# Patient Record
Sex: Female | Born: 1935 | Race: White | Hispanic: No | Marital: Single | Smoking: Former smoker
Health system: Southern US, Community
[De-identification: ages and names within clinical notes are randomized; demographics above are authoritative.]

## PROBLEM LIST (undated history)

## (undated) DIAGNOSIS — E119 Type 2 diabetes mellitus without complications: Secondary | ICD-10-CM

## (undated) DIAGNOSIS — K219 Gastro-esophageal reflux disease without esophagitis: Secondary | ICD-10-CM

## (undated) DIAGNOSIS — J45909 Unspecified asthma, uncomplicated: Secondary | ICD-10-CM

## (undated) DIAGNOSIS — M1990 Unspecified osteoarthritis, unspecified site: Secondary | ICD-10-CM

## (undated) DIAGNOSIS — I499 Cardiac arrhythmia, unspecified: Secondary | ICD-10-CM

## (undated) DIAGNOSIS — E079 Disorder of thyroid, unspecified: Secondary | ICD-10-CM

## (undated) DIAGNOSIS — I10 Essential (primary) hypertension: Secondary | ICD-10-CM

## (undated) DIAGNOSIS — C50919 Malignant neoplasm of unspecified site of unspecified female breast: Secondary | ICD-10-CM

## (undated) DIAGNOSIS — F0390 Unspecified dementia without behavioral disturbance: Secondary | ICD-10-CM

## (undated) HISTORY — PX: MASTECTOMY: SHX3

## (undated) HISTORY — PX: CHOLECYSTECTOMY: SHX55

---

## 2017-09-29 ENCOUNTER — Other Ambulatory Visit: Payer: Self-pay

## 2017-09-29 ENCOUNTER — Encounter (HOSPITAL_BASED_OUTPATIENT_CLINIC_OR_DEPARTMENT_OTHER): Payer: Self-pay

## 2017-09-29 ENCOUNTER — Observation Stay (HOSPITAL_BASED_OUTPATIENT_CLINIC_OR_DEPARTMENT_OTHER)
Admission: EM | Admit: 2017-09-29 | Discharge: 2017-09-30 | Disposition: A | Discharge status: Home Health | Payer: Medicare Other | Attending: Internal Medicine | Admitting: Internal Medicine

## 2017-09-29 DIAGNOSIS — J45909 Unspecified asthma, uncomplicated: Secondary | ICD-10-CM | POA: Diagnosis not present

## 2017-09-29 DIAGNOSIS — Z7984 Long term (current) use of oral hypoglycemic drugs: Secondary | ICD-10-CM | POA: Insufficient documentation

## 2017-09-29 DIAGNOSIS — Z888 Allergy status to other drugs, medicaments and biological substances status: Secondary | ICD-10-CM | POA: Insufficient documentation

## 2017-09-29 DIAGNOSIS — E785 Hyperlipidemia, unspecified: Secondary | ICD-10-CM | POA: Diagnosis not present

## 2017-09-29 DIAGNOSIS — F329 Major depressive disorder, single episode, unspecified: Secondary | ICD-10-CM | POA: Diagnosis not present

## 2017-09-29 DIAGNOSIS — F0390 Unspecified dementia without behavioral disturbance: Secondary | ICD-10-CM | POA: Insufficient documentation

## 2017-09-29 DIAGNOSIS — Z886 Allergy status to analgesic agent status: Secondary | ICD-10-CM | POA: Insufficient documentation

## 2017-09-29 DIAGNOSIS — I10 Essential (primary) hypertension: Secondary | ICD-10-CM | POA: Diagnosis not present

## 2017-09-29 DIAGNOSIS — M1990 Unspecified osteoarthritis, unspecified site: Secondary | ICD-10-CM | POA: Insufficient documentation

## 2017-09-29 DIAGNOSIS — E119 Type 2 diabetes mellitus without complications: Secondary | ICD-10-CM | POA: Insufficient documentation

## 2017-09-29 DIAGNOSIS — Z882 Allergy status to sulfonamides status: Secondary | ICD-10-CM | POA: Insufficient documentation

## 2017-09-29 DIAGNOSIS — K219 Gastro-esophageal reflux disease without esophagitis: Secondary | ICD-10-CM | POA: Insufficient documentation

## 2017-09-29 DIAGNOSIS — Z7951 Long term (current) use of inhaled steroids: Secondary | ICD-10-CM | POA: Diagnosis not present

## 2017-09-29 DIAGNOSIS — G2581 Restless legs syndrome: Secondary | ICD-10-CM | POA: Diagnosis not present

## 2017-09-29 DIAGNOSIS — Z79891 Long term (current) use of opiate analgesic: Secondary | ICD-10-CM | POA: Diagnosis not present

## 2017-09-29 DIAGNOSIS — Z87891 Personal history of nicotine dependence: Secondary | ICD-10-CM | POA: Diagnosis not present

## 2017-09-29 DIAGNOSIS — Z79899 Other long term (current) drug therapy: Secondary | ICD-10-CM | POA: Insufficient documentation

## 2017-09-29 DIAGNOSIS — E039 Hypothyroidism, unspecified: Secondary | ICD-10-CM | POA: Insufficient documentation

## 2017-09-29 DIAGNOSIS — E876 Hypokalemia: Secondary | ICD-10-CM | POA: Diagnosis present

## 2017-09-29 DIAGNOSIS — Z853 Personal history of malignant neoplasm of breast: Secondary | ICD-10-CM | POA: Diagnosis not present

## 2017-09-29 DIAGNOSIS — E860 Dehydration: Principal | ICD-10-CM | POA: Insufficient documentation

## 2017-09-29 HISTORY — DX: Cardiac arrhythmia, unspecified: I49.9

## 2017-09-29 HISTORY — DX: Malignant neoplasm of unspecified site of unspecified female breast: C50.919

## 2017-09-29 HISTORY — DX: Essential (primary) hypertension: I10

## 2017-09-29 HISTORY — DX: Gastro-esophageal reflux disease without esophagitis: K21.9

## 2017-09-29 HISTORY — DX: Unspecified dementia, unspecified severity, without behavioral disturbance, psychotic disturbance, mood disturbance, and anxiety: F03.90

## 2017-09-29 HISTORY — DX: Type 2 diabetes mellitus without complications: E11.9

## 2017-09-29 HISTORY — DX: Unspecified asthma, uncomplicated: J45.909

## 2017-09-29 HISTORY — DX: Unspecified osteoarthritis, unspecified site: M19.90

## 2017-09-29 HISTORY — DX: Disorder of thyroid, unspecified: E07.9

## 2017-09-29 LAB — CBC WITH DIFFERENTIAL/PLATELET
Basophils Absolute: 0 10*3/uL (ref 0.0–0.1)
Basophils Relative: 0 %
EOS ABS: 0.1 10*3/uL (ref 0.0–0.7)
EOS PCT: 0 %
HCT: 31.8 % — ABNORMAL LOW (ref 36.0–46.0)
Hemoglobin: 11.1 g/dL — ABNORMAL LOW (ref 12.0–15.0)
LYMPHS ABS: 1.1 10*3/uL (ref 0.7–4.0)
LYMPHS PCT: 7 %
MCH: 31.9 pg (ref 26.0–34.0)
MCHC: 34.9 g/dL (ref 30.0–36.0)
MCV: 91.4 fL (ref 78.0–100.0)
MONO ABS: 0.9 10*3/uL (ref 0.1–1.0)
MONOS PCT: 5 %
Neutro Abs: 14 10*3/uL — ABNORMAL HIGH (ref 1.7–7.7)
Neutrophils Relative %: 88 %
PLATELETS: 292 10*3/uL (ref 150–400)
RBC: 3.48 MIL/uL — ABNORMAL LOW (ref 3.87–5.11)
RDW: 13.1 % (ref 11.5–15.5)
WBC: 16 10*3/uL — ABNORMAL HIGH (ref 4.0–10.5)

## 2017-09-29 LAB — COMPREHENSIVE METABOLIC PANEL
ALT: 37 U/L (ref 14–54)
ANION GAP: 16 — AB (ref 5–15)
AST: 61 U/L — ABNORMAL HIGH (ref 15–41)
Albumin: 3.1 g/dL — ABNORMAL LOW (ref 3.5–5.0)
Alkaline Phosphatase: 57 U/L (ref 38–126)
BUN: 22 mg/dL — ABNORMAL HIGH (ref 6–20)
CALCIUM: 7.8 mg/dL — AB (ref 8.9–10.3)
CHLORIDE: 93 mmol/L — AB (ref 101–111)
CO2: 22 mmol/L (ref 22–32)
Creatinine, Ser: 0.95 mg/dL (ref 0.44–1.00)
GFR, EST NON AFRICAN AMERICAN: 55 mL/min — AB (ref >60)
Glucose, Bld: 255 mg/dL — ABNORMAL HIGH (ref 65–99)
Potassium: 2.6 mmol/L — CL (ref 3.5–5.1)
SODIUM: 131 mmol/L — AB (ref 135–145)
Total Bilirubin: 0.6 mg/dL (ref 0.3–1.2)
Total Protein: 7.2 g/dL (ref 6.5–8.1)

## 2017-09-29 LAB — URINALYSIS, ROUTINE W REFLEX MICROSCOPIC
BILIRUBIN URINE: NEGATIVE
Glucose, UA: NEGATIVE mg/dL
Hgb urine dipstick: NEGATIVE
KETONES UR: NEGATIVE mg/dL
NITRITE: NEGATIVE
Protein, ur: NEGATIVE mg/dL
SPECIFIC GRAVITY, URINE: 1.01 (ref 1.005–1.030)
pH: 6 (ref 5.0–8.0)

## 2017-09-29 LAB — MAGNESIUM: Magnesium: 1 mg/dL — ABNORMAL LOW (ref 1.7–2.4)

## 2017-09-29 LAB — URINALYSIS, MICROSCOPIC (REFLEX): RBC / HPF: NONE SEEN RBC/hpf (ref 0–5)

## 2017-09-29 LAB — PHOSPHORUS: PHOSPHORUS: 3.8 mg/dL (ref 2.5–4.6)

## 2017-09-29 MED ORDER — POTASSIUM CHLORIDE 10 MEQ/100ML IV SOLN
10.0000 meq | Freq: Once | INTRAVENOUS | Status: AC
  Administered 2017-09-29: 10 meq via INTRAVENOUS
  Filled 2017-09-29: qty 100

## 2017-09-29 MED ORDER — POTASSIUM CHLORIDE CRYS ER 20 MEQ PO TBCR
40.0000 meq | EXTENDED_RELEASE_TABLET | Freq: Once | ORAL | Status: AC
  Administered 2017-09-29: 40 meq via ORAL
  Filled 2017-09-29: qty 2

## 2017-09-29 MED ORDER — SODIUM CHLORIDE 0.9 % IV SOLN
1.0000 g | Freq: Once | INTRAVENOUS | Status: AC
  Administered 2017-09-29: 1 g via INTRAVENOUS
  Filled 2017-09-29: qty 10

## 2017-09-29 MED ORDER — SODIUM CHLORIDE 0.9 % IV SOLN: INTRAVENOUS | Status: DC

## 2017-09-29 MED ORDER — SODIUM CHLORIDE 0.9 % IV SOLN
INTRAVENOUS | Status: DC
  Administered 2017-09-29 – 2017-09-30 (×3): via INTRAVENOUS

## 2017-09-29 MED ORDER — SODIUM CHLORIDE 0.9 % IV BOLUS (SEPSIS)
500.0000 mL | Freq: Once | INTRAVENOUS | Status: AC
  Administered 2017-09-29: 500 mL via INTRAVENOUS

## 2017-09-29 NOTE — ED Notes (Signed)
ED Provider at bedside. 

## 2017-09-29 NOTE — ED Notes (Signed)
Daughter at bedside during this transfer.  Patient was able to eat dinner prior to transfer.  Transported via [facility] in stable condition.

## 2017-09-29 NOTE — ED Triage Notes (Signed)
Per pt's daughter pt with increase in weakness-recently treated for UTI and diarrhea-pt NAD-presents to triage in w/c

## 2017-09-29 NOTE — ED Notes (Signed)
Date and time results received: 09/29/17 2011  Test: potassium Critical Value: 2.6  Name of Provider Notified: Dr. Rogene Houston  Orders Received? Or Actions Taken?: awaiting further orders

## 2017-09-29 NOTE — ED Provider Notes (Signed)
[facility] EMERGENCY DEPARTMENT Provider Note   CSN: [id number] Arrival date & time: 09/29/17  1857     History   Chief Complaint Chief Complaint  Patient presents with  . Weakness    HPI Adriana Sanchez is a 83 y.o. female.  Patient brought in by her daughter.  Patient has been experiencing weakness got significantly weaker today.  About a week ago was having a diarrheal illness.  And was treated for UTI.  Patient's last diarrhea bowel movement was on Sunday.  No complaint of pain no abdominal pain no nausea no vomiting.  Just global weakness.  No fevers.  No upper respiratory or flulike symptoms.  Has had confirmation that the urinary tract infection had resolved.      Past Medical History:  Diagnosis Date  . Arthritis   . Asthma   . Breast cancer ([place])   . Dementia   . Diabetes mellitus without complication ([place])   . GERD (gastroesophageal reflux disease)   . Hypertension   . Irregular heartbeat   . Thyroid disease     There are no active problems to display for this patient.   Past Surgical History:  Procedure Laterality Date  . CHOLECYSTECTOMY    . MASTECTOMY      OB History    No data available       Home Medications    Prior to Admission medications   Medication Sig Start Date End Date Taking? Authorizing Provider  albuterol (ACCUNEB) 1.25 MG/3ML nebulizer solution Take 1 ampule by nebulization every 6 (six) hours as needed for wheezing.   Yes [provider]  atorvastatin (LIPITOR) 10 MG tablet Take 10 mg by mouth daily.   Yes [provider]  calcium-vitamin D (OSCAL WITH D) 250-125 MG-UNIT tablet Take 1 tablet by mouth daily.   Yes [provider]  donepezil (ARICEPT) 10 MG tablet Take 10 mg by mouth at bedtime.   Yes [provider]  DULoxetine (CYMBALTA) 30 MG capsule Take 30 mg by mouth daily.   Yes [provider]  fluticasone furoate-vilanterol (BREO ELLIPTA) 100-25 MCG/INH AEPB Inhale 1  puff into the lungs daily.   Yes [provider]  furosemide (LASIX) 20 MG tablet Take 20 mg by mouth.   Yes [provider]  ketoconazole (NIZORAL) 2 % cream Apply 1 application topically daily.   Yes [provider]  Lactobacillus Acidophilus POWD by Does not apply route.   Yes [provider]  lansoprazole (PREVACID) 30 MG capsule Take 30 mg by mouth daily at 12 noon.   Yes [provider]  levothyroxine (SYNTHROID, LEVOTHROID) 50 MCG tablet Take 50 mcg by mouth daily before breakfast.   Yes [provider]  loratadine (CLARITIN) 10 MG tablet Take 10 mg by mouth daily.   Yes [provider]  magnesium 30 MG tablet Take 30 mg by mouth once.   Yes [provider]  memantine (NAMENDA) 10 MG tablet Take 10 mg by mouth 2 (two) times daily.   Yes [provider]  metFORMIN (GLUCOPHAGE) 500 MG tablet Take 1,000 mg by mouth daily with breakfast.   Yes [provider]  metoprolol tartrate (LOPRESSOR) 50 MG tablet Take 50 mg by mouth 2 (two) times daily.   Yes [provider]  metroNIDAZOLE (FLAGYL) 500 MG tablet Take 500 mg by mouth 3 (three) times daily.   Yes [provider]  montelukast (SINGULAIR) 10 MG tablet Take 10 mg by mouth at bedtime.  Yes [provider]  nystatin (MYCOSTATIN) 100000 UNIT/ML suspension Take 5 mLs by mouth 4 (four) times daily.   Yes [provider]  pantoprazole (PROTONIX) 40 MG tablet Take 40 mg by mouth daily.   Yes [provider]  potassium gluconate 595 (99 K) MG TABS tablet Take 595 mg by mouth daily.   Yes [provider]  rOPINIRole (REQUIP) 1 MG tablet Take 1 mg by mouth at bedtime.   Yes [provider]  telmisartan (MICARDIS) 40 MG tablet Take 40 mg by mouth daily.   Yes [provider]  tolterodine (DETROL LA) 4 MG 24 hr capsule Take 4 mg by mouth daily.   Yes [provider]  traMADol (ULTRAM)  50 MG tablet Take by mouth daily.   Yes [provider]  vitamin C (ASCORBIC ACID) 500 MG tablet Take 500 mg by mouth daily.   Yes [provider]  Zinc Sulfate (ZINC 15 PO) Take by mouth 2 (two) times daily.   Yes [provider]    Family History No family history on file.  Social History Social History   Tobacco Use  . Smoking status: Former Research scientist (life sciences)  . Smokeless tobacco: Never Used  Substance Use Topics  . Alcohol use: No    Frequency: Never  . Drug use: No     Allergies   Aspirin; Simvastatin; Sulfa antibiotics; and Univasc [moexipril]   Review of Systems Review of Systems  Constitutional: Positive for fatigue. Negative for fever.  HENT: Negative for congestion.   Eyes: Negative for redness.  Respiratory: Negative for cough and shortness of breath.   Cardiovascular: Negative for chest pain.  Gastrointestinal: Positive for diarrhea. Negative for abdominal pain, nausea and vomiting.  Genitourinary: Negative for dysuria.  Musculoskeletal: Negative for myalgias.  Skin: Negative for rash.  Neurological: Positive for weakness. Negative for dizziness, syncope, speech difficulty and headaches.  Hematological: Does not bruise/bleed easily.  Psychiatric/Behavioral: Negative for confusion.     Physical Exam Updated Vital Signs BP 137/72 (BP Location: Left Arm)   Pulse 79   Temp 98.3 F (36.8 C) (Oral)   Resp (!) 22   Ht 1.499 m (4\' 11" )   Wt 62.6 kg (138 lb)   SpO2 98%   BMI 27.87 kg/m   Physical Exam  Constitutional: She is oriented to person, place, and time. She appears well-developed and well-nourished.  HENT:  Head: Normocephalic and atraumatic.  Mucous membranes slightly dry.  Eyes: Conjunctivae and EOM are normal. Pupils are equal, round, and reactive to light.  Neck: Normal range of motion. Neck supple.  Cardiovascular: Normal rate, regular rhythm and normal heart sounds.  Pulmonary/Chest: Effort normal and breath sounds  normal. No respiratory distress.  Abdominal: Soft. Bowel sounds are normal. She exhibits distension. There is no tenderness.  Abdomen nontender.  Musculoskeletal: Normal range of motion. She exhibits no edema.  Neurological: She is alert and oriented to person, place, and time. No cranial nerve deficit or sensory deficit. She exhibits normal muscle tone. Coordination normal.  Skin: Skin is warm. Capillary refill takes less than 2 seconds. No rash noted.  Nursing note and vitals reviewed.    ED Treatments / Results  Labs (all labs ordered are listed, but only abnormal results are displayed) Labs Reviewed  CBC WITH DIFFERENTIAL/PLATELET - Abnormal; Notable for the following components:      Result Value   WBC 16.0 (*)    RBC 3.48 (*)    Hemoglobin 11.1 (*)  HCT 31.8 (*)    Neutro Abs 14.0 (*)    All other components within normal limits  COMPREHENSIVE METABOLIC PANEL - Abnormal; Notable for the following components:   Sodium 131 (*)    Potassium 2.6 (*)    Chloride 93 (*)    Glucose, Bld 255 (*)    BUN 22 (*)    Calcium 7.8 (*)    Albumin 3.1 (*)    AST 61 (*)    GFR calc non Af Amer 55 (*)    Anion gap 16 (*)    All other components within normal limits  URINE CULTURE  URINALYSIS, ROUTINE W REFLEX MICROSCOPIC    EKG  EKG Interpretation  Date/Time:  Wednesday September 29 2017 19:08:30 EST Ventricular Rate:  80 PR Interval:  150 QRS Duration: 94 QT Interval:  442 QTC Calculation: 509 R Axis:   9 Text Interpretation:  Normal sinus rhythm Cannot rule out Anterior infarct , age undetermined Abnormal ECG No previous ECGs available Confirmed by Fredia Sorrow [phone]) on 09/29/2017 7:47:58 PM       Radiology No results found.  Procedures Procedures (including critical care time) CRITICAL CARE Performed by: Yeshua Stryker Total critical care time: 30 minutes Critical care time was exclusive of separately billable procedures and treating other patients. Critical  care was necessary to treat or prevent imminent or life-threatening deterioration. Critical care was time spent personally by me on the following activities: development of treatment plan with patient and/or surrogate as well as nursing, discussions with consultants, evaluation of patient's response to treatment, examination of patient, obtaining history from patient or surrogate, ordering and performing treatments and interventions, ordering and review of laboratory studies, ordering and review of radiographic studies, pulse oximetry and re-evaluation of patient's condition.    Medications Ordered in ED Medications  0.9 %  sodium chloride infusion ( Intravenous New Bag/Given 09/29/17 2059)  potassium chloride 10 mEq in 100 mL IVPB (not administered)  potassium chloride 10 mEq in 100 mL IVPB (0 mEq Intravenous Stopped 09/29/17 2058)  sodium chloride 0.9 % bolus 500 mL (0 mLs Intravenous Stopped 09/29/17 2058)     Initial Impression / Assessment and Plan / ED Course  I have reviewed the triage vital signs and the nursing notes.  Pertinent labs & imaging results that were available during my care of the patient were reviewed by me and considered in my medical decision making (see chart for details).     Patient with a history of diarrheal illness.  Has resolved.  Potassium critically low here today at 2.6.  Will require some IV potassium as well as oral potassium.  Patient prefers to be admitted to [facility] regional.  Patient's primary care doctor is Addison Naegeli from [facility] family [facility].  Discussed with [facility] long hospital.  [facility] without any bed availability.  Patient will be admitted to a telemetry bed observation admission.  Discussed with Dr. Aggie Moats.  He also recommended giving calcium gluconate for her low calcium.  That has been ordered.  Patient for the low potassium received 20 mEq potassium IV as well as 40 mEq p.o.  Patient overall stable.  Patient also given 1 g  of calcium gluconate for the low calcium and for the prolonged QT.  Final Clinical Impressions(s) / ED Diagnoses   Final diagnoses:  Hypokalemia    ED Discharge Orders    None       Fredia Sorrow, MD 09/29/17 2254

## 2017-09-29 NOTE — Plan of Care (Signed)
[facility]  Reason for admit: K+ 2.6 & dehydration & Long QTc Patient given IV fluids.  Potassium was already ordered.  Corrected calcium which was still low.  Requested calcium be given.  EDP agreed.  Order: Mg and Phos  Diarrhea for a few days that resolved Sunday.  CC: weak and tired  Status: Obs Tele  Elwin Mocha, MD

## 2017-09-30 DIAGNOSIS — G2581 Restless legs syndrome: Secondary | ICD-10-CM

## 2017-09-30 DIAGNOSIS — E876 Hypokalemia: Secondary | ICD-10-CM | POA: Diagnosis not present

## 2017-09-30 DIAGNOSIS — E860 Dehydration: Secondary | ICD-10-CM

## 2017-09-30 LAB — COMPREHENSIVE METABOLIC PANEL
ALBUMIN: 2.8 g/dL — AB (ref 3.5–5.0)
ALT: 37 U/L (ref 14–54)
AST: 50 U/L — AB (ref 15–41)
Alkaline Phosphatase: 51 U/L (ref 38–126)
Anion gap: 11 (ref 5–15)
BILIRUBIN TOTAL: 0.5 mg/dL (ref 0.3–1.2)
BUN: 14 mg/dL (ref 6–20)
CHLORIDE: 99 mmol/L — AB (ref 101–111)
CO2: 23 mmol/L (ref 22–32)
Calcium: 7.8 mg/dL — ABNORMAL LOW (ref 8.9–10.3)
Creatinine, Ser: 0.71 mg/dL (ref 0.44–1.00)
GFR calc Af Amer: >60 mL/min (ref >60)
GFR calc non Af Amer: >60 mL/min (ref >60)
GLUCOSE: 180 mg/dL — AB (ref 65–99)
POTASSIUM: 3.6 mmol/L (ref 3.5–5.1)
Sodium: 133 mmol/L — ABNORMAL LOW (ref 135–145)
Total Protein: 6.3 g/dL — ABNORMAL LOW (ref 6.5–8.1)

## 2017-09-30 LAB — PHOSPHORUS: Phosphorus: 2.5 mg/dL (ref 2.5–4.6)

## 2017-09-30 LAB — CBC
HEMATOCRIT: 27.1 % — AB (ref 36.0–46.0)
Hemoglobin: 9.8 g/dL — ABNORMAL LOW (ref 12.0–15.0)
MCH: 32 pg (ref 26.0–34.0)
MCHC: 36.2 g/dL — ABNORMAL HIGH (ref 30.0–36.0)
MCV: 88.6 fL (ref 78.0–100.0)
PLATELETS: 256 10*3/uL (ref 150–400)
RBC: 3.06 MIL/uL — ABNORMAL LOW (ref 3.87–5.11)
RDW: 13.6 % (ref 11.5–15.5)
WBC: 11.8 10*3/uL — AB (ref 4.0–10.5)

## 2017-09-30 LAB — GLUCOSE, CAPILLARY
GLUCOSE-CAPILLARY: 175 mg/dL — AB (ref 65–99)
GLUCOSE-CAPILLARY: 187 mg/dL — AB (ref 65–99)
GLUCOSE-CAPILLARY: 143 mg/dL — AB (ref 65–99)

## 2017-09-30 LAB — MAGNESIUM: Magnesium: 1.5 mg/dL — ABNORMAL LOW (ref 1.7–2.4)

## 2017-09-30 MED ORDER — METOPROLOL TARTRATE 50 MG PO TABS
50.0000 mg | ORAL_TABLET | Freq: Two times a day (BID) | ORAL | Status: DC
  Administered 2017-09-30 (×2): 50 mg via ORAL
  Filled 2017-09-30 (×2): qty 1

## 2017-09-30 MED ORDER — LEVOTHYROXINE SODIUM 50 MCG PO TABS
50.0000 ug | ORAL_TABLET | Freq: Every day | ORAL | Status: DC
  Administered 2017-09-30: 50 ug via ORAL
  Filled 2017-09-30: qty 1

## 2017-09-30 MED ORDER — ACETAMINOPHEN 650 MG RE SUPP: 650.0000 mg | Freq: Four times a day (QID) | RECTAL | Status: DC | PRN

## 2017-09-30 MED ORDER — MEMANTINE HCL 10 MG PO TABS
10.0000 mg | ORAL_TABLET | Freq: Two times a day (BID) | ORAL | Status: DC
  Administered 2017-09-30: 10 mg via ORAL
  Filled 2017-09-30: qty 1

## 2017-09-30 MED ORDER — IRBESARTAN 150 MG PO TABS
150.0000 mg | ORAL_TABLET | Freq: Every day | ORAL | Status: DC
  Administered 2017-09-30: 150 mg via ORAL
  Filled 2017-09-30: qty 1

## 2017-09-30 MED ORDER — ALBUTEROL SULFATE (2.5 MG/3ML) 0.083% IN NEBU: 2.5000 mg | INHALATION_SOLUTION | Freq: Four times a day (QID) | RESPIRATORY_TRACT | Status: DC | PRN

## 2017-09-30 MED ORDER — HYDRALAZINE HCL 20 MG/ML IJ SOLN: 10.0000 mg | Freq: Three times a day (TID) | INTRAMUSCULAR | Status: DC | PRN

## 2017-09-30 MED ORDER — PANTOPRAZOLE SODIUM 40 MG PO TBEC
40.0000 mg | DELAYED_RELEASE_TABLET | Freq: Every day | ORAL | Status: DC
  Administered 2017-09-30: 40 mg via ORAL
  Filled 2017-09-30: qty 1

## 2017-09-30 MED ORDER — SODIUM CHLORIDE 0.9% FLUSH
3.0000 mL | Freq: Two times a day (BID) | INTRAVENOUS | Status: DC
  Administered 2017-09-30: 3 mL via INTRAVENOUS

## 2017-09-30 MED ORDER — DONEPEZIL HCL 10 MG PO TABS: 10.0000 mg | ORAL_TABLET | Freq: Every day | ORAL | Status: DC

## 2017-09-30 MED ORDER — POTASSIUM CHLORIDE 10 MEQ/100ML IV SOLN
10.0000 meq | INTRAVENOUS | Status: AC
  Administered 2017-09-30 (×4): 10 meq via INTRAVENOUS
  Filled 2017-09-30 (×4): qty 100

## 2017-09-30 MED ORDER — ZINC SULFATE 66 MG PO TABS: ORAL_TABLET | Freq: Every evening | ORAL | Status: DC

## 2017-09-30 MED ORDER — FLUTICASONE FUROATE-VILANTEROL 100-25 MCG/INH IN AEPB
1.0000 | INHALATION_SPRAY | Freq: Every day | RESPIRATORY_TRACT | Status: DC
  Administered 2017-09-30: 10:00:00 1 via RESPIRATORY_TRACT
  Filled 2017-09-30: qty 28

## 2017-09-30 MED ORDER — INSULIN ASPART 100 UNIT/ML ~~LOC~~ SOLN
0.0000 [IU] | Freq: Three times a day (TID) | SUBCUTANEOUS | Status: DC
  Administered 2017-09-30: 2 [IU] via SUBCUTANEOUS
  Administered 2017-09-30 (×2): 3 [IU] via SUBCUTANEOUS

## 2017-09-30 MED ORDER — MAGNESIUM SULFATE 2 GM/50ML IV SOLN
2.0000 g | Freq: Once | INTRAVENOUS | Status: AC
  Administered 2017-09-30: 2 g via INTRAVENOUS
  Filled 2017-09-30: qty 50

## 2017-09-30 MED ORDER — FESOTERODINE FUMARATE ER 8 MG PO TB24
8.0000 mg | ORAL_TABLET | Freq: Every day | ORAL | Status: DC
  Administered 2017-09-30: 8 mg via ORAL
  Filled 2017-09-30: qty 1

## 2017-09-30 MED ORDER — ROPINIROLE HCL 1 MG PO TABS: 1.0000 mg | ORAL_TABLET | Freq: Every day | ORAL | Status: DC

## 2017-09-30 MED ORDER — DULOXETINE HCL 30 MG PO CPEP
30.0000 mg | ORAL_CAPSULE | Freq: Every day | ORAL | Status: DC
  Administered 2017-09-30: 30 mg via ORAL
  Filled 2017-09-30: qty 1

## 2017-09-30 MED ORDER — HEPARIN SODIUM (PORCINE) 5000 UNIT/ML IJ SOLN
5000.0000 [IU] | Freq: Three times a day (TID) | INTRAMUSCULAR | Status: DC
  Administered 2017-09-30 (×2): 5000 [IU] via SUBCUTANEOUS
  Filled 2017-09-30 (×2): qty 1

## 2017-09-30 MED ORDER — ATORVASTATIN CALCIUM 10 MG PO TABS
10.0000 mg | ORAL_TABLET | Freq: Every day | ORAL | Status: DC
  Administered 2017-09-30: 10 mg via ORAL
  Filled 2017-09-30: qty 1

## 2017-09-30 MED ORDER — ACETAMINOPHEN 325 MG PO TABS: 650.0000 mg | ORAL_TABLET | Freq: Four times a day (QID) | ORAL | Status: DC | PRN

## 2017-09-30 NOTE — Care Management Note (Signed)
Case Management Note  Patient Details  Name: Adriana Sanchez MRN: [id number] Date of Birth: 1934-11-06  Subjective/Objective: Noted PT recc SNF-CSW following. Spoke to dtr Adriana Sanchez about back up plan-she is agreealbe to [employer] in past. Rep Tommi Rumps aware to follow. Recc HHRN/PT/OT/aide/CSW if needed.admit will check on insurance-[employer] medicare.                   Action/Plan:d/c SNF.   Expected Discharge Date:                  Expected Discharge Plan:  [place]  In-House Referral:  Clinical Social Work  Discharge planning Services  CM Consult  Post Acute Care Choice:  Durable Medical Equipment(rw) Choice offered to:  Adult Children  DME Arranged:    DME Agency:     HH Arranged:    HH Agency:     Status of Service:  In process, will continue to follow  If discussed at Long Length of Stay Meetings, dates discussed:    Additional Comments:  Dessa Phi, RN 09/30/2017, 3:09 PM

## 2017-09-30 NOTE — Care Management Note (Signed)
Case Management Note  Patient Details  Name: Adriana Sanchez MRN: [id number] Date of Birth: 1935/07/29  Subjective/Objective:Admit confirmed d/c home w/HHC-[employer] rep Tommi Rumps aware of d/c today. Dtr Santiago Glad aware of [place] & d/c. Santiago Glad able to take patient home on own. No further CM needs.                    Action/Plan:d/c home w/HHC.   Expected Discharge Date:  09/30/17               Expected Discharge Plan:  [place]  In-House Referral:  Clinical Social Work  Discharge planning Services  CM Consult  Post Acute Care Choice:  Durable Medical Equipment(rw) Choice offered to:  Adult Children  DME Arranged:    DME Agency:     HH Arranged:  RN, PT, OT, Nurse's Aide, Social Work [employer] Agency:  [facility]  Status of Service:  Completed, signed off  If discussed at [employer], dates discussed:    Additional Comments:  Dessa Phi, RN 09/30/2017, 3:42 PM

## 2017-09-30 NOTE — H&P (Addendum)
[facility] History and Physical  Lindsee Labarre KZS:[id number] DOB: Sep 17, 1934 DOA: 09/29/2017  Referring physician:  PCP: No primary care provider on file.   Chief Complaint: "I have just been weak."  HPI: Adriana Sanchez is a 83 y.o. female with past medical history significant for arthritis, thyroid disease, diabetes, dementia and asthma is in the emergency room with chief complaint of being weak.  Patient has had several days of diarrhea and decreased oral intake due to nausea vomiting.  Diarrhea stopped on Sunday.  Patient's appetite however did not recover.  She was so weak she could not get up to fix herself food or fluids.  And daughters urging patient came to the emergency room for evaluation.  ED course: Patient found to be dehydrated.  IV fluid started.  Patient's potassium low.  This was replaced.  Hospitalist called for admission.   Review of Systems:  As per HPI otherwise 10 point review of systems negative.    Past Medical History:  Diagnosis Date  . Arthritis   . Asthma   . Breast cancer ([place])   . Dementia   . Diabetes mellitus without complication ([place])   . GERD (gastroesophageal reflux disease)   . Hypertension   . Irregular heartbeat   . Thyroid disease    Past Surgical History:  Procedure Laterality Date  . CHOLECYSTECTOMY    . MASTECTOMY     Social History:  reports that she has quit smoking. she has never used smokeless tobacco. She reports that she does not drink alcohol or use drugs.  Allergies  Allergen Reactions  . Aspirin   . Simvastatin   . Sulfa Antibiotics   . Univasc [Moexipril]     No family history on file.   Prior to Admission medications   Medication Sig Start Date End Date Taking? Authorizing Provider  albuterol (ACCUNEB) 1.25 MG/3ML nebulizer solution Take 1 ampule by nebulization every 6 (six) hours as needed for wheezing.   Yes [provider]  atorvastatin (LIPITOR) 10 MG tablet Take 10 mg by mouth daily.   Yes  [provider]  calcium-vitamin D (OSCAL WITH D) 250-125 MG-UNIT tablet Take 1 tablet by mouth daily.   Yes [provider]  donepezil (ARICEPT) 10 MG tablet Take 10 mg by mouth at bedtime.   Yes [provider]  DULoxetine (CYMBALTA) 30 MG capsule Take 30 mg by mouth daily.   Yes [provider]  fluticasone furoate-vilanterol (BREO ELLIPTA) 100-25 MCG/INH AEPB Inhale 1 puff into the lungs daily.   Yes [provider]  furosemide (LASIX) 20 MG tablet Take 20 mg by mouth.   Yes [provider]  ketoconazole (NIZORAL) 2 % cream Apply 1 application topically daily.   Yes [provider]  Lactobacillus Acidophilus POWD by Does not apply route.   Yes [provider]  lansoprazole (PREVACID) 30 MG capsule Take 30 mg by mouth daily at 12 noon.   Yes [provider]  levothyroxine (SYNTHROID, LEVOTHROID) 50 MCG tablet Take 50 mcg by mouth daily before breakfast.   Yes [provider]  loratadine (CLARITIN) 10 MG tablet Take 10 mg by mouth daily.   Yes [provider]  magnesium 30 MG tablet Take 30 mg by mouth once.   Yes [provider]  memantine (NAMENDA) 10 MG tablet Take 10 mg by mouth 2 (two) times daily.   Yes [provider]  metFORMIN (GLUCOPHAGE) 500 MG tablet Take 1,000 mg by mouth daily with breakfast.  Yes [provider]  metoprolol tartrate (LOPRESSOR) 50 MG tablet Take 50 mg by mouth 2 (two) times daily.   Yes [provider]  metroNIDAZOLE (FLAGYL) 500 MG tablet Take 500 mg by mouth 3 (three) times daily.   Yes [provider]  montelukast (SINGULAIR) 10 MG tablet Take 10 mg by mouth at bedtime.   Yes [provider]  nystatin (MYCOSTATIN) 100000 UNIT/ML suspension Take 5 mLs by mouth 4 (four) times daily.   Yes [provider]  pantoprazole (PROTONIX) 40 MG tablet Take 40 mg by mouth daily.   Yes [provider]    potassium gluconate 595 (99 K) MG TABS tablet Take 595 mg by mouth daily.   Yes [provider]  rOPINIRole (REQUIP) 1 MG tablet Take 1 mg by mouth at bedtime.   Yes [provider]  telmisartan (MICARDIS) 40 MG tablet Take 40 mg by mouth daily.   Yes [provider]  tolterodine (DETROL LA) 4 MG 24 hr capsule Take 4 mg by mouth daily.   Yes [provider]  traMADol (ULTRAM) 50 MG tablet Take by mouth daily.   Yes [provider]  vitamin C (ASCORBIC ACID) 500 MG tablet Take 500 mg by mouth daily.   Yes [provider]  Zinc Sulfate (ZINC 15 PO) Take by mouth 2 (two) times daily.   Yes [provider]   Physical Exam: Vitals:   09/29/17 1905 09/29/17 1906 09/29/17 2131 09/29/17 2301  BP: 137/72  (!) 142/57   Pulse: 79  82   Resp: (!) 22  19   Temp: 98.3 F (36.8 C)     TempSrc: Oral     SpO2: 98%  100% 100%  Weight:  62.6 kg (138 lb)    Height:  4\' 11"  (1.499 m)      Wt Readings from Last 3 Encounters:  09/29/17 62.6 kg (138 lb)    General:  Appears calm and comfortable; A&Ox3, dehydrated, dry mouth Eyes:  PERRL, EOMI, normal lids, iris ENT:  grossly normal hearing, lips & tongue Neck:  no LAD, masses or thyromegaly Cardiovascular:  RRR, no m/r/g. No LE edema.  Respiratory:  CTA bilaterally, no w/r/r. Normal respiratory effort. Abdomen:  soft, ntnd Skin:  no rash or induration seen on limited exam Musculoskeletal:  grossly normal tone BUE/BLE; weak Psychiatric:  grossly normal mood and affect, speech fluent and appropriate Neurologic:  CN 2-12 grossly intact, moves all extremities in coordinated fashion.          Labs on Admission:  Basic Metabolic Panel: Recent Labs  Lab 09/29/17 1922  NA 131*  K 2.6*  CL 93*  CO2 22  GLUCOSE 255*  BUN 22*  CREATININE 0.95  CALCIUM 7.8*  MG 1.0*  PHOS 3.8   Liver Function Tests: Recent Labs  Lab 09/29/17 1922  AST 61*  ALT 37  ALKPHOS 57  BILITOT 0.6   PROT 7.2  ALBUMIN 3.1*   No results for input(s): LIPASE, AMYLASE in the last 168 hours. No results for input(s): AMMONIA in the last 168 hours. CBC: Recent Labs  Lab 09/29/17 1922  WBC 16.0*  NEUTROABS 14.0*  HGB 11.1*  HCT 31.8*  MCV 91.4  PLT 292   Cardiac Enzymes: No results for input(s): CKTOTAL, CKMB, CKMBINDEX, TROPONINI in the last 168 hours.  BNP (last 3 results) No results for input(s): BNP in the last 8760 hours.  ProBNP (last 3 results) No results for input(s): PROBNP in  the last 8760 hours.   Serum creatinine: 0.95 mg/dL 09/29/17 1922 Estimated creatinine clearance: 38 mL/min  CBG: No results for input(s): GLUCAP in the last 168 hours.  Radiological Exams on Admission: No results found.  EKG: Independently reviewed. Long QTc, NSR.   Assessment/Plan Active Problems:   Hypokalemia  Low K and other lytes Will replace and recheck Likely due to low oral intake and GI symptoms.  Dehydration IV fluids Encourage oral intake Hold lasix  DM SSI AC Hold metformin  Hyperlipidemia Continue statin  HTN Cont lopressor, micardia->avapro  Asthma Cont breo Prn albuterol  Depression Cont cymbalta No SI/HI  Dementia Cont aricept & namenda  Bladder Control Issues Detrol->toviaz  Hypothyroidism Cont OP synthroid 50 mcg qd No signs of hyper or hypothyroidism  Allergies Hold claritin, singulair  RLS Cont requip  GERD Cont PPI  Code Status: FC  DVT Prophylaxis: SCDs/heparin Family Communication: dgtr at bedside Disposition Plan: Pending Improvement  Status: tele, obs  Elwin Mocha, MD Family Medicine [facility] [email or web address] Password [email or web address]

## 2017-09-30 NOTE — NC FL2 (Signed)
[place] MEDICAID FL2 LEVEL OF CARE SCREENING TOOL     IDENTIFICATION  Patient Name: Adriana Sanchez Birthdate: 04-05-1935 Sex: female Admission Date (Current Location): 09/29/2017  [facility] and [place] Number:  Herbalist and Address:  [facility],  [place], [place]      Provider Number: [id number]  Attending Physician Name and Address:  Donne Hazel, MD  Relative Name and Phone Number:       Current Level of Care: Hospital Recommended Level of Care: [place] Prior Approval Number:    Date Approved/Denied: 09/30/17 PASRR Number: [id number]  Discharge Plan: SNF    Current Diagnoses: Patient Active Problem List   Diagnosis Date Noted  . Hypokalemia 09/29/2017    Orientation RESPIRATION BLADDER Height & Weight     Self, Time, Situation, Place  Normal External catheter, Continent Weight: 142 lb 6.7 oz (64.6 kg) Height:  4\' 11"  (149.9 cm)  BEHAVIORAL SYMPTOMS/MOOD NEUROLOGICAL BOWEL NUTRITION STATUS      Continent Diet(See dc )  AMBULATORY STATUS COMMUNICATION OF NEEDS Skin   Extensive Assist Verbally Normal                       Personal Care Assistance Level of Assistance  Bathing, Feeding, Dressing Bathing Assistance: Maximum assistance Feeding assistance: Independent Dressing Assistance: Maximum assistance     Functional Limitations Info  Sight, Hearing, Speech Sight Info: Adequate Hearing Info: Impaired(hearing aids) Speech Info: Adequate    SPECIAL CARE FACTORS FREQUENCY  PT (By licensed PT), OT (By licensed OT)     PT Frequency: 5x/week OT Frequency: 5x/week            Contractures Contractures Info: Not present    Additional Factors Info  Code Status, Allergies Code Status Info: Full Allergies Info: Aspirin, Moexipril, Simvastatin, Sulfa Antibiotics           Current Medications (09/30/2017):  This is the current hospital active medication list Current  Facility-Administered Medications  Medication Dose Route Frequency Provider Last Rate Last Dose  . 0.9 %  sodium chloride infusion   Intravenous Continuous Fredia Sorrow, MD 75 mL/hr at 09/29/17 2059    . acetaminophen (TYLENOL) tablet 650 mg  650 mg Oral Q6H PRN Elwin Mocha, MD       Or  . acetaminophen (TYLENOL) suppository 650 mg  650 mg Rectal Q6H PRN Elwin Mocha, MD      . albuterol (PROVENTIL) (2.5 MG/3ML) 0.083% nebulizer solution 2.5 mg  2.5 mg Nebulization Q6H PRN Elwin Mocha, MD      . atorvastatin (LIPITOR) tablet 10 mg  10 mg Oral Daily Elwin Mocha, MD   10 mg at 09/30/17 4132  . donepezil (ARICEPT) tablet 10 mg  10 mg Oral QHS Elwin Mocha, MD      . DULoxetine (CYMBALTA) DR capsule 30 mg  30 mg Oral Daily Elwin Mocha, MD   30 mg at 09/30/17 4401  . fesoterodine (TOVIAZ) tablet 8 mg  8 mg Oral Daily Elwin Mocha, MD   8 mg at 09/30/17 0851  . fluticasone furoate-vilanterol (BREO ELLIPTA) 100-25 MCG/INH 1 puff  1 puff Inhalation Daily Elwin Mocha, MD   1 puff at 09/30/17 0932  . heparin injection 5,000 Units  5,000 Units Subcutaneous Q8H Elwin Mocha, MD   5,000 Units at 09/30/17 [phone]  . hydrALAZINE (APRESOLINE) injection 10 mg  10 mg Intravenous Q8H PRN Benjaman Lobe  M, MD      . insulin aspart (novoLOG) injection 0-15 Units  0-15 Units Subcutaneous TID WC Elwin Mocha, MD   3 Units at 09/30/17 1159  . irbesartan (AVAPRO) tablet 150 mg  150 mg Oral Daily Elwin Mocha, MD   150 mg at 09/30/17 9767  . levothyroxine (SYNTHROID, LEVOTHROID) tablet 50 mcg  50 mcg Oral QAC breakfast Elwin Mocha, MD   50 mcg at 09/30/17 [phone]  . memantine (NAMENDA) tablet 10 mg  10 mg Oral BID Elwin Mocha, MD   10 mg at 09/30/17 3790  . metoprolol tartrate (LOPRESSOR) tablet 50 mg  50 mg Oral BID Elwin Mocha, MD   50 mg at 09/30/17 2409  . pantoprazole (PROTONIX) EC tablet 40 mg  40 mg Oral Daily Elwin Mocha, MD   40 mg at 09/30/17 7353   . rOPINIRole (REQUIP) tablet 1 mg  1 mg Oral QHS Elwin Mocha, MD      . sodium chloride flush (NS) 0.9 % injection 3 mL  3 mL Intravenous Q12H Elwin Mocha, MD   3 mL at 09/30/17 0032     Discharge Medications: Please see discharge summary for a list of discharge medications.  Relevant Imaging Results:  Relevant Lab Results:   Additional Information ssn: [id number]  Servando Snare, LCSW

## 2017-09-30 NOTE — Clinical Social Work Note (Signed)
Clinical Social Work Assessment  Patient Details  Name: Adriana Sanchez MRN: [id number] Date of Birth: 25-Nov-1934  Date of referral:  09/30/17               Reason for consult:  Facility Placement, Discharge Planning                Permission sought to share information with:  Facility Sport and exercise psychologist, Family Supports Permission granted to share information::  Yes, Verbal Permission Granted  Name::     Adriana Sanchez   Agency::     Relationship::  daughter  Contact Information:   [phone]  Housing/Transportation Living arrangements for the past 2 months:  Apartment, Independent Living Facility([place]) Source of Information:  Patient, Adult Children Patient Interpreter Needed:  None Criminal Activity/Legal Involvement Pertinent to Current Situation/Hospitalization:  No - Comment as needed Significant Relationships:  Adult Children Lives with:  Self Do you feel safe going back to the place where you live?  (PT recommending SNF) Need for family participation in patient care:  Yes (Comment)  Care giving concerns:  Patient from independent living facility and lives alone. Patient reported that she was independent with ADLs and ambulation prior to hospitalization. Patient's daughter reported that patient has been having issues getting around her apartment and completing ADLs. PT recommending SNF.    Social Worker assessment / plan:  CSW spoke with patient at bedside regarding PT recommendation for SNF. Patient reported that she is agreeable. CSW explained SNF placement process. CSW informed patient that no insurance was listed on her chart, patient reported that she has [employer]. Patient requested that CSW speak with her daughter further about SNF. CSW contacted patient's daughter and discussed SNF recommendation and process. Patient's daughter reported that patient has traditional medicare. CSW explained medicare requiring inpatient 3 midnight qualifying stay to pay  for SNF for ST rehab, patient's daughter verbalized understanding. CSW inquired if patient's daughter was able to private pay, patient's daughter reported that she was unable. CSW agreed to contact patient's [employer] to see if patient had any respite SNF days in her contract. Patient's daughter reported that if patient didn't patient would have to return home with home health services.  CSW contacted [facility] [phone]) and left message for admissions staff member Abigail Butts. CSW received return call from staff stating that independent living facility residents that admit to SNF are only covered under medicare benefits or private pay.   CSW contacted patient's daughter and provided update, patient's daughter reported that they will have to dc home with home health services. CSW agreed to inform patient's RNCM.  CSW informed patient's RNCM to follow up regarding patient's home health service needs.  Patient discharging home, [place] signing off.     Employment status:  Retired Forensic scientist:  [employer] PT Recommendations:  [place] / Referral to community resources:  [place], Other (Comment Required)(RNCM referral for home health services)  Patient/Family's Response to care:  Patient's daughter appreciative of CSW assistance with discharge planning.  Patient/Family's Understanding of and Emotional Response to Diagnosis, Current Treatment, and Prognosis:  Patient presented calm and cooperative. Patient's daughter involved in patient's care and verbalized understanding of patient's diagnosis and current treatment. Patient's daughter upset about patient's insurance not covering SNF and verbalized plan for patient to discharge home due to lack of payor source for SNF. CSW normalized patient's daughter's feelings and informed her that RNCM would follow up about available home health services. Patient's daughter receptive  to home  health services for patient.   Emotional Assessment Appearance:  Appears stated age Attitude/Demeanor/Rapport:  Other(Cooperative) Affect (typically observed):  Calm Orientation:  Oriented to Self, Oriented to Place, Oriented to Situation, Oriented to  Time Alcohol / Substance use:  Not Applicable Psych involvement (Current and /or in the community):  No (Comment)  Discharge Needs  Concerns to be addressed:  Care Coordination Readmission within the last 30 days:  No Current discharge risk:  Lives alone, Physical Impairment Barriers to Discharge:  No Barriers Identified   Burnis Medin, LCSW 09/30/2017, 3:38 PM -

## 2017-09-30 NOTE — Discharge Summary (Signed)
Physician Discharge Summary  Adriana Sanchez DJM:[id number] DOB: Jul 01, 1935 DOA: 09/29/2017  PCP: System, Pcp Not In  Admit date: 09/29/2017 Discharge date: 09/30/2017  Admitted From: Home Disposition:  Home  Recommendations for Outpatient Follow-up:  1. Follow up with PCP in 1-2 weeks 2. Resume lasix on 10/02/17 3. Repeat basic metabolic panel in 1 week  Home Health:PT/OT/SW/RN    Discharge Condition:Improved CODE STATUS:Full Diet recommendation: Regular   Brief/Interim Summary: 83 y.o.femalewith past medical history significant for arthritis, thyroid disease, diabetes, dementia and asthma is in the emergency room with chief complaint of being weak. Patient has had several days of diarrhea and decreased oral intake due to nausea vomiting. Diarrhea stopped on Sunday. Patient's appetite however did not recover. She was so weak she could not get up to fix herself food or fluids. And daughters urgingpatient came to the emergency room for evaluation.  ED course: Patient found to be dehydrated. IV fluid started. Patient's potassium low. This was replaced. Hospitalist called for admission.  Low K and low Mg Likely related to recent diarrhea Corrected overnight  Dehydration Improved with IVF hydration Tolerating regular diet Lasix presently on hold while in hospital  DM Continue with SSI AC Metformin placed on hold this admit  Hyperlipidemia Continue statin as tolerated  HTN Cont lopressor, micardia->avapro as tolerated  Asthma Cont breo Continue with prn albuterol  Depression Cont cymbalta as tolerated  Dementia Cont aricept & namenda as tolerated  Bladder Control Issues Detrol->toviaz Stable at this time  Hypothyroidism Cont OP synthroid63mcg qd Patient without signs of hyper or hypothyroidism  RLS Cont requip as tolerated  GERD Cont PPI   Discharge Diagnoses:  Active Problems:   Hypokalemia    Discharge  Instructions   Allergies as of 09/30/2017      Reactions   Aspirin Other (See Comments)   bruising   Moexipril Other (See Comments)   unknown   Simvastatin Other (See Comments)   Myalgias (intolerance)   Sulfa Antibiotics Other (See Comments)   unknown      Medication List    TAKE these medications   acidophilus Caps capsule Take 1 capsule by mouth daily.   albuterol 1.25 MG/3ML nebulizer solution Commonly known as:  ACCUNEB Take 1 ampule by nebulization every 6 (six) hours as needed for wheezing.   atorvastatin 10 MG tablet Commonly known as:  LIPITOR Take 10 mg by mouth daily.   BREO ELLIPTA 100-25 MCG/INH Aepb Generic drug:  fluticasone furoate-vilanterol Inhale 1 puff into the lungs daily.   calcium-vitamin D 250-125 MG-UNIT tablet Commonly known as:  OSCAL WITH D Take 1 tablet by mouth daily.   donepezil 10 MG tablet Commonly known as:  ARICEPT Take 10 mg by mouth at bedtime.   DULoxetine 30 MG capsule Commonly known as:  CYMBALTA Take 30 mg by mouth daily.   furosemide 20 MG tablet Commonly known as:  LASIX Take 20 mg by mouth daily.   ketoconazole 2 % cream Commonly known as:  NIZORAL Apply 1 application topically daily.   levothyroxine 50 MCG tablet Commonly known as:  SYNTHROID, LEVOTHROID Take 50 mcg by mouth daily before breakfast.   loratadine 10 MG tablet Commonly known as:  CLARITIN Take 10 mg by mouth daily.   magnesium 30 MG tablet Take 30 mg by mouth daily.   memantine 10 MG tablet Commonly known as:  NAMENDA Take 10 mg by mouth 2 (two) times daily.   metFORMIN 500 MG tablet Commonly known as:  GLUCOPHAGE Take 1,000  mg by mouth every evening.   metoprolol tartrate 50 MG tablet Commonly known as:  LOPRESSOR Take 50 mg by mouth 2 (two) times daily.   montelukast 10 MG tablet Commonly known as:  SINGULAIR Take 10 mg by mouth at bedtime.   omeprazole 40 MG capsule Commonly known as:  PRILOSEC Take 40 mg by mouth daily.    potassium gluconate 595 (99 K) MG Tabs tablet Take 595 mg by mouth daily.   rOPINIRole 1 MG tablet Commonly known as:  REQUIP Take 1 mg by mouth at bedtime.   telmisartan 40 MG tablet Commonly known as:  MICARDIS Take 40 mg by mouth daily.   tolterodine 4 MG 24 hr capsule Commonly known as:  DETROL LA Take 4 mg by mouth daily.   traMADol 50 MG tablet Commonly known as:  ULTRAM Take 50 mg by mouth daily as needed for moderate pain.   vitamin C 500 MG tablet Commonly known as:  ASCORBIC ACID Take 500 mg by mouth daily.   ZINC 15 PO Take 2 capsules by mouth every evening.       Allergies  Allergen Reactions  . Aspirin Other (See Comments)    bruising  . Moexipril Other (See Comments)    unknown  . Simvastatin Other (See Comments)    Myalgias (intolerance)  . Sulfa Antibiotics Other (See Comments)    unknown    Procedures/Studies: No results found.  Subjective: Reports feeling better today. Tolerating regular diet  Discharge Exam: Vitals:   09/30/17 0932 09/30/17 1243  BP:  (!) 116/53  Pulse:  76  Resp:  18  Temp:  98.3 F (36.8 C)  SpO2: 98% 98%   Vitals:   09/30/17 0015 09/30/17 0459 09/30/17 0932 09/30/17 1243  BP: 136/60 (!) 138/58  (!) 116/53  Pulse: 89 94  76  Resp: 20 20  18   Temp: 98.6 F (37 C) 99.4 F (37.4 C)  98.3 F (36.8 C)  TempSrc: Oral Oral  Oral  SpO2: 100%  98% 98%  Weight: 64.6 kg (142 lb 6.7 oz) 64.6 kg (142 lb 6.7 oz)    Height: 4\' 11"  (1.499 m)       General: Pt is alert, awake, not in acute distress Cardiovascular: RRR, S1/S2 +, no rubs, no gallops Respiratory: CTA bilaterally, no wheezing, no rhonchi Abdominal: Soft, NT, ND, bowel sounds + Extremities: no edema, no cyanosis   The results of significant diagnostics from this hospitalization (including imaging, microbiology, ancillary and laboratory) are listed below for reference.     Microbiology: No results found for this or any previous visit (from the past  240 hour(s)).   Labs: BNP (last 3 results) No results for input(s): BNP in the last 8760 hours. Basic Metabolic Panel: Recent Labs  Lab 09/29/17 1922 09/30/17 0601  NA 131* 133*  K 2.6* 3.6  CL 93* 99*  CO2 22 23  GLUCOSE 255* 180*  BUN 22* 14  CREATININE 0.95 0.71  CALCIUM 7.8* 7.8*  MG 1.0* 1.5*  PHOS 3.8 2.5   Liver Function Tests: Recent Labs  Lab 09/29/17 1922 09/30/17 0601  AST 61* 50*  ALT 37 37  ALKPHOS 57 51  BILITOT 0.6 0.5  PROT 7.2 6.3*  ALBUMIN 3.1* 2.8*   No results for input(s): LIPASE, AMYLASE in the last 168 hours. No results for input(s): AMMONIA in the last 168 hours. CBC: Recent Labs  Lab 09/29/17 1922 09/30/17 0601  WBC 16.0* 11.8*  NEUTROABS 14.0*  --  HGB 11.1* 9.8*  HCT 31.8* 27.1*  MCV 91.4 88.6  PLT 292 256   Cardiac Enzymes: No results for input(s): CKTOTAL, CKMB, CKMBINDEX, TROPONINI in the last 168 hours. BNP: Invalid input(s): POCBNP CBG: Recent Labs  Lab 09/30/17 0845 09/30/17 1134  GLUCAP 175* 187*   D-Dimer No results for input(s): DDIMER in the last 72 hours. Hgb A1c No results for input(s): HGBA1C in the last 72 hours. Lipid Profile No results for input(s): CHOL, HDL, LDLCALC, TRIG, CHOLHDL, LDLDIRECT in the last 72 hours. Thyroid function studies No results for input(s): TSH, T4TOTAL, T3FREE, THYROIDAB in the last 72 hours.  Invalid input(s): FREET3 Anemia work up No results for input(s): VITAMINB12, FOLATE, FERRITIN, TIBC, IRON, RETICCTPCT in the last 72 hours. Urinalysis    Component Value Date/Time   COLORURINE YELLOW 09/29/2017 2115   APPEARANCEUR CLEAR 09/29/2017 2115   LABSPEC 1.010 09/29/2017 2115   PHURINE 6.0 09/29/2017 2115   GLUCOSEU NEGATIVE 09/29/2017 2115   HGBUR NEGATIVE 09/29/2017 2115   BILIRUBINUR NEGATIVE 09/29/2017 2115   KETONESUR NEGATIVE 09/29/2017 2115   PROTEINUR NEGATIVE 09/29/2017 2115   NITRITE NEGATIVE 09/29/2017 2115   LEUKOCYTESUR TRACE (A) 09/29/2017 2115   Sepsis  Labs Invalid input(s): PROCALCITONIN,  WBC,  LACTICIDVEN Microbiology No results found for this or any previous visit (from the past 240 hour(s)).   SIGNED:   Marylu Lund, MD  [facility] 09/30/2017, 3:32 PM  If 7PM-7AM, please contact night-coverage [email or web address] Password [email or web address]

## 2017-09-30 NOTE — Evaluation (Signed)
Physical Therapy Evaluation Patient Details Name: Adriana Sanchez MRN: [id number] DOB: 17-Aug-1935 Today's Date: 09/30/2017   History of Present Illness  83 y.o. female with past medical history significant for arthritis, thyroid disease, diabetes, dementia and asthma and admitted with generalized weakness and hypokalemia.  Clinical Impression  Pt admitted with above diagnosis. Pt currently with functional limitations due to the deficits listed below (see PT Problem List).  Pt will benefit from skilled PT to increase their independence and safety with mobility to allow discharge to the venue listed below.  Pt assisted with ambulating in hallway and presents with limited endurance and generalized weakness. Pt reports she does not feel she can d/c home alone safely in current condition and agreeable to SNF for rehab if possible.     Follow Up Recommendations SNF    Equipment Recommendations  None recommended by PT    Recommendations for Other Services       Precautions / Restrictions Precautions Precautions: Fall      Mobility  Bed Mobility Overal bed mobility: Needs Assistance Bed Mobility: Supine to Sit     Supine to sit: Min assist     General bed mobility comments: assist for trunk upright  Transfers Overall transfer level: Needs assistance Equipment used: Rolling walker (2 wheeled) Transfers: Sit to/from Stand Sit to Stand: Min assist         General transfer comment: slight assist to rise  Ambulation/Gait Ambulation/Gait assistance: Min assist Ambulation Distance (Feet): 40 Feet Assistive device: Rolling walker (2 wheeled) Gait Pattern/deviations: Step-through pattern;Decreased stride length;Trunk flexed Gait velocity: decr   General Gait Details: verbal cues for posture and RW positioning (pt typically uses rollator), HR in 90s during session  Stairs            Wheelchair Mobility    Modified Rankin (Stroke Patients Only)       Balance Overall  balance assessment: Needs assistance         Standing balance support: Bilateral upper extremity supported Standing balance-Leahy Scale: Poor Standing balance comment: requires UE support                             Pertinent Vitals/Pain Pain Assessment: No/denies pain    Home Living Family/patient expects to be discharged to:: Unsure Living Arrangements: Alone               Additional Comments: reports living alone, no steps at home, uses rollator for mobility    Prior Function Level of Independence: Independent with assistive device(s)               Hand Dominance        Extremity/Trunk Assessment   Upper Extremity Assessment Upper Extremity Assessment: Generalized weakness    Lower Extremity Assessment Lower Extremity Assessment: Generalized weakness    Cervical / Trunk Assessment Cervical / Trunk Assessment: Kyphotic  Communication   Communication: No difficulties  Cognition Arousal/Alertness: Awake/alert Behavior During Therapy: WFL for tasks assessed/performed Overall Cognitive Status: Within Functional Limits for tasks assessed                                 General Comments: hx dementia, appropriate overall status during session      General Comments General comments (skin integrity, edema, etc.): Pt reports last fall was 6 months ago in parking lot "walking around her walker to get to car"  Exercises     Assessment/Plan    PT Assessment Patient needs continued PT services  PT Problem List Decreased strength;Decreased mobility;Decreased activity tolerance;Decreased knowledge of use of DME;Decreased balance       PT Treatment Interventions Gait training;Therapeutic exercise;DME instruction;Therapeutic activities;Patient/family education;Functional mobility training;Balance training    PT Goals (Current goals can be found in the Care Plan section)  Acute Rehab PT Goals PT Goal Formulation: With  patient Time For Goal Achievement: 10/14/17 Potential to Achieve Goals: Good    Frequency Min 3X/week   Barriers to discharge        Co-evaluation               AM-PAC PT "6 Clicks" Daily Activity  Outcome Measure Difficulty turning over in bed (including adjusting bedclothes, sheets and blankets)?: A Lot Difficulty moving from lying on back to sitting on the side of the bed? : Unable Difficulty sitting down on and standing up from a chair with arms (e.g., wheelchair, bedside commode, etc,.)?: Unable Help needed moving to and from a bed to chair (including a wheelchair)?: A Little Help needed walking in hospital room?: A Little Help needed climbing 3-5 steps with a railing? : A Lot 6 Click Score: 12    End of Session Equipment Utilized During Treatment: Gait belt Activity Tolerance: Patient limited by fatigue Patient left: in chair;with chair alarm set;with call bell/phone within reach Nurse Communication: Mobility status PT Visit Diagnosis: Difficulty in walking, not elsewhere classified (R26.2);Muscle weakness (generalized) (M62.81)    Time: 8841-6606 PT Time Calculation (min) (ACUTE ONLY): 16 min   Charges:   PT Evaluation $PT Eval Low Complexity: 1 Low     PT G Codes:        Carmelia Bake, PT, DPT 09/30/2017 Pager: [phone] [place] Ram E 09/30/2017, 12:54 PM

## 2017-10-01 LAB — URINE CULTURE: Special Requests: NORMAL

## 2017-10-25 ENCOUNTER — Emergency Department (HOSPITAL_BASED_OUTPATIENT_CLINIC_OR_DEPARTMENT_OTHER): Payer: Medicare Other

## 2017-10-25 ENCOUNTER — Observation Stay (HOSPITAL_BASED_OUTPATIENT_CLINIC_OR_DEPARTMENT_OTHER)
Admission: EM | Admit: 2017-10-25 | Discharge: 2017-10-27 | Disposition: A | Discharge status: Home / Self Care | Payer: Medicare Other | Attending: Internal Medicine | Admitting: Internal Medicine

## 2017-10-25 ENCOUNTER — Encounter (HOSPITAL_BASED_OUTPATIENT_CLINIC_OR_DEPARTMENT_OTHER): Payer: Self-pay | Admitting: *Deleted

## 2017-10-25 ENCOUNTER — Other Ambulatory Visit: Payer: Self-pay

## 2017-10-25 DIAGNOSIS — F329 Major depressive disorder, single episode, unspecified: Secondary | ICD-10-CM | POA: Insufficient documentation

## 2017-10-25 DIAGNOSIS — E119 Type 2 diabetes mellitus without complications: Secondary | ICD-10-CM | POA: Insufficient documentation

## 2017-10-25 DIAGNOSIS — E876 Hypokalemia: Secondary | ICD-10-CM | POA: Diagnosis not present

## 2017-10-25 DIAGNOSIS — I10 Essential (primary) hypertension: Secondary | ICD-10-CM | POA: Insufficient documentation

## 2017-10-25 DIAGNOSIS — R112 Nausea with vomiting, unspecified: Secondary | ICD-10-CM | POA: Diagnosis not present

## 2017-10-25 DIAGNOSIS — Z87891 Personal history of nicotine dependence: Secondary | ICD-10-CM | POA: Diagnosis not present

## 2017-10-25 DIAGNOSIS — Z79890 Hormone replacement therapy (postmenopausal): Secondary | ICD-10-CM | POA: Insufficient documentation

## 2017-10-25 DIAGNOSIS — R9431 Abnormal electrocardiogram [ECG] [EKG]: Secondary | ICD-10-CM | POA: Diagnosis not present

## 2017-10-25 DIAGNOSIS — Z7951 Long term (current) use of inhaled steroids: Secondary | ICD-10-CM | POA: Insufficient documentation

## 2017-10-25 DIAGNOSIS — R262 Difficulty in walking, not elsewhere classified: Secondary | ICD-10-CM | POA: Insufficient documentation

## 2017-10-25 DIAGNOSIS — R1310 Dysphagia, unspecified: Secondary | ICD-10-CM | POA: Insufficient documentation

## 2017-10-25 DIAGNOSIS — G2581 Restless legs syndrome: Secondary | ICD-10-CM | POA: Insufficient documentation

## 2017-10-25 DIAGNOSIS — R531 Weakness: Secondary | ICD-10-CM | POA: Insufficient documentation

## 2017-10-25 DIAGNOSIS — Z853 Personal history of malignant neoplasm of breast: Secondary | ICD-10-CM | POA: Insufficient documentation

## 2017-10-25 DIAGNOSIS — E871 Hypo-osmolality and hyponatremia: Principal | ICD-10-CM

## 2017-10-25 DIAGNOSIS — E039 Hypothyroidism, unspecified: Secondary | ICD-10-CM | POA: Insufficient documentation

## 2017-10-25 DIAGNOSIS — Z79899 Other long term (current) drug therapy: Secondary | ICD-10-CM | POA: Insufficient documentation

## 2017-10-25 DIAGNOSIS — B370 Candidal stomatitis: Secondary | ICD-10-CM | POA: Insufficient documentation

## 2017-10-25 DIAGNOSIS — Z9010 Acquired absence of unspecified breast and nipple: Secondary | ICD-10-CM | POA: Diagnosis not present

## 2017-10-25 DIAGNOSIS — F0390 Unspecified dementia without behavioral disturbance: Secondary | ICD-10-CM | POA: Insufficient documentation

## 2017-10-25 DIAGNOSIS — E785 Hyperlipidemia, unspecified: Secondary | ICD-10-CM | POA: Diagnosis not present

## 2017-10-25 DIAGNOSIS — E860 Dehydration: Secondary | ICD-10-CM | POA: Diagnosis not present

## 2017-10-25 DIAGNOSIS — K219 Gastro-esophageal reflux disease without esophagitis: Secondary | ICD-10-CM | POA: Diagnosis not present

## 2017-10-25 DIAGNOSIS — Z7984 Long term (current) use of oral hypoglycemic drugs: Secondary | ICD-10-CM | POA: Insufficient documentation

## 2017-10-25 DIAGNOSIS — R109 Unspecified abdominal pain: Secondary | ICD-10-CM

## 2017-10-25 DIAGNOSIS — N179 Acute kidney failure, unspecified: Secondary | ICD-10-CM | POA: Diagnosis not present

## 2017-10-25 DIAGNOSIS — N390 Urinary tract infection, site not specified: Secondary | ICD-10-CM | POA: Diagnosis not present

## 2017-10-25 LAB — CBC WITH DIFFERENTIAL/PLATELET
BASOS ABS: 0 10*3/uL (ref 0.0–0.1)
BASOS PCT: 0 %
Eosinophils Absolute: 0 10*3/uL (ref 0.0–0.7)
Eosinophils Relative: 0 %
HCT: 35.4 % — ABNORMAL LOW (ref 36.0–46.0)
HEMOGLOBIN: 12.4 g/dL (ref 12.0–15.0)
LYMPHS PCT: 7 %
Lymphs Abs: 1.3 10*3/uL (ref 0.7–4.0)
MCH: 32 pg (ref 26.0–34.0)
MCHC: 35 g/dL (ref 30.0–36.0)
MCV: 91.2 fL (ref 78.0–100.0)
Monocytes Absolute: 1.1 10*3/uL — ABNORMAL HIGH (ref 0.1–1.0)
Monocytes Relative: 6 %
NEUTROS PCT: 87 %
Neutro Abs: 17.2 10*3/uL — ABNORMAL HIGH (ref 1.7–7.7)
Platelets: 263 10*3/uL (ref 150–400)
RBC: 3.88 MIL/uL (ref 3.87–5.11)
RDW: 13.9 % (ref 11.5–15.5)
WBC: 19.6 10*3/uL — ABNORMAL HIGH (ref 4.0–10.5)

## 2017-10-25 LAB — COMPREHENSIVE METABOLIC PANEL
ALBUMIN: 3.7 g/dL (ref 3.5–5.0)
ALK PHOS: 56 U/L (ref 38–126)
ALT: 29 U/L (ref 14–54)
AST: 27 U/L (ref 15–41)
Anion gap: 14 (ref 5–15)
BUN: 26 mg/dL — AB (ref 6–20)
CALCIUM: 8.9 mg/dL (ref 8.9–10.3)
CO2: 23 mmol/L (ref 22–32)
CREATININE: 1 mg/dL (ref 0.44–1.00)
Chloride: 90 mmol/L — ABNORMAL LOW (ref 101–111)
GFR calc Af Amer: >60 mL/min (ref >60)
GFR, EST NON AFRICAN AMERICAN: 52 mL/min — AB (ref >60)
GLUCOSE: 200 mg/dL — AB (ref 65–99)
Potassium: 4.2 mmol/L (ref 3.5–5.1)
Sodium: 127 mmol/L — ABNORMAL LOW (ref 135–145)
Total Bilirubin: 0.9 mg/dL (ref 0.3–1.2)
Total Protein: 7.8 g/dL (ref 6.5–8.1)

## 2017-10-25 LAB — I-STAT CG4 LACTIC ACID, ED: Lactic Acid, Venous: 0.94 mmol/L (ref 0.5–1.9)

## 2017-10-25 LAB — MAGNESIUM: Magnesium: 1.1 mg/dL — ABNORMAL LOW (ref 1.7–2.4)

## 2017-10-25 LAB — TROPONIN I: Troponin I: <0.03 ng/mL (ref <0.03)

## 2017-10-25 MED ORDER — MAGNESIUM SULFATE 2 GM/50ML IV SOLN
2.0000 g | Freq: Once | INTRAVENOUS | Status: AC
  Administered 2017-10-25: 2 g via INTRAVENOUS
  Filled 2017-10-25: qty 50

## 2017-10-25 MED ORDER — AMOXICILLIN-POT CLAVULANATE 875-125 MG PO TABS: 1.0000 | ORAL_TABLET | Freq: Two times a day (BID) | ORAL | 0 refills | Status: DC

## 2017-10-25 MED ORDER — SODIUM CHLORIDE 0.9 % IV SOLN
Freq: Once | INTRAVENOUS | Status: AC
  Administered 2017-10-26: 11:00:00 via INTRAVENOUS

## 2017-10-25 NOTE — ED Triage Notes (Signed)
She had blood work last week. 2 days ago she was called and told her sodium was low. She has been nauseated and unable to eat today. Family is afraid her sodium is lower.

## 2017-10-25 NOTE — ED Provider Notes (Signed)
[facility] DEPARTMENT Provider Note   CSN: [id number] Arrival date & time: 10/25/17  1707     History   Chief Complaint Chief Complaint  Patient presents with  . Hyponatremia    HPI Adriana Sanchez is a 83 y.o. female.  HPI  83 year old female presents with weakness and concern for hyponatremia.  The patient has had decreased p.o. intake and trouble with swallowing and vomiting for over a month.  She has been having left upper quadrant abdominal pain for a month and is due for an endoscopy in 3 days.  Last week she had labs drawn by her PCP and then was called 2 days ago stating that her sodium was low.  The patient does not eat much due to lack of appetite.  Today she was having trouble swallowing even pills with some vomiting and so they brought her in here instead of back to the PCP to check on the sodium.  Patient has felt diffusely weak and fatigued with no energy.  She is been able to get out of bed but not do anything further.  No cough, shortness of breath, or chest pain.  No urinary symptoms.  She has had on and off headaches for quite some time but no new headaches.  No unilateral weakness.  Past Medical History:  Diagnosis Date  . Arthritis   . Asthma   . Breast cancer ([place])   . Dementia   . Diabetes mellitus without complication ([place])   . GERD (gastroesophageal reflux disease)   . Hypertension   . Irregular heartbeat   . Thyroid disease     Patient Active Problem List   Diagnosis Date Noted  . Hyponatremia 10/25/2017  . Hypokalemia 09/29/2017    Past Surgical History:  Procedure Laterality Date  . CHOLECYSTECTOMY    . MASTECTOMY      OB History    No data available       Home Medications    Prior to Admission medications   Medication Sig Start Date End Date Taking? Authorizing Provider  acidophilus (RISAQUAD) CAPS capsule Take 1 capsule by mouth daily.    [provider]  albuterol (ACCUNEB) 1.25 MG/3ML nebulizer  solution Take 1 ampule by nebulization every 6 (six) hours as needed for wheezing.    [provider]  atorvastatin (LIPITOR) 10 MG tablet Take 10 mg by mouth daily.    [provider]  calcium-vitamin D (OSCAL WITH D) 250-125 MG-UNIT tablet Take 1 tablet by mouth daily.    [provider]  donepezil (ARICEPT) 10 MG tablet Take 10 mg by mouth at bedtime.    [provider]  DULoxetine (CYMBALTA) 30 MG capsule Take 30 mg by mouth daily.    [provider]  fluticasone furoate-vilanterol (BREO ELLIPTA) 100-25 MCG/INH AEPB Inhale 1 puff into the lungs daily.    [provider]  furosemide (LASIX) 20 MG tablet Take 20 mg by mouth daily.     [provider]  ketoconazole (NIZORAL) 2 % cream Apply 1 application topically daily.    [provider]  levothyroxine (SYNTHROID, LEVOTHROID) 50 MCG tablet Take 50 mcg by mouth daily before breakfast.    [provider]  loratadine (CLARITIN) 10 MG tablet Take 10 mg by mouth daily.    [provider]  magnesium 30 MG tablet Take 30 mg by mouth daily.     [provider]  memantine (NAMENDA) 10 MG tablet Take 10 mg by mouth 2 (  two) times daily.    [provider]  metFORMIN (GLUCOPHAGE) 500 MG tablet Take 1,000 mg by mouth every evening.     [provider]  metoprolol tartrate (LOPRESSOR) 50 MG tablet Take 50 mg by mouth 2 (two) times daily.    [provider]  montelukast (SINGULAIR) 10 MG tablet Take 10 mg by mouth at bedtime.    [provider]  omeprazole (PRILOSEC) 40 MG capsule Take 40 mg by mouth daily.    [provider]  potassium gluconate 595 (99 K) MG TABS tablet Take 595 mg by mouth daily.    [provider]  rOPINIRole (REQUIP) 1 MG tablet Take 1 mg by mouth at bedtime.    [provider]  telmisartan (MICARDIS) 40 MG tablet Take 40 mg by mouth daily.    [provider]    tolterodine (DETROL LA) 4 MG 24 hr capsule Take 4 mg by mouth daily.    [provider]  traMADol (ULTRAM) 50 MG tablet Take 50 mg by mouth daily as needed for moderate pain.     [provider]  vitamin C (ASCORBIC ACID) 500 MG tablet Take 500 mg by mouth daily.    [provider]  Zinc Sulfate (ZINC 15 PO) Take 2 capsules by mouth every evening.     [provider]    Family History No family history on file.  Social History Social History   Tobacco Use  . Smoking status: Former Research scientist (life sciences)  . Smokeless tobacco: Never Used  Substance Use Topics  . Alcohol use: No    Frequency: Never  . Drug use: No     Allergies   Aspirin; Moexipril; Simvastatin; and Sulfa antibiotics   Review of Systems Review of Systems  Constitutional: Positive for fatigue. Negative for fever.  Respiratory: Negative for cough and shortness of breath.   Cardiovascular: Negative for chest pain.  Gastrointestinal: Positive for abdominal pain and vomiting.  Neurological: Positive for weakness and headaches.  All other systems reviewed and are negative.    Physical Exam Updated Vital Signs BP (!) 157/60   Pulse (!) 101   Temp 98.6 F (37 C) (Oral)   Resp (!) 24   Ht 4\' 11"  (1.499 m)   Wt 56.2 kg (124 lb)   SpO2 96%   BMI 25.04 kg/m   Physical Exam  Constitutional: She is oriented to person, place, and time. She appears well-developed and well-nourished. No distress.  HENT:  Head: Normocephalic and atraumatic.  Right Ear: External ear normal.  Left Ear: External ear normal.  Nose: Nose normal.  Eyes: EOM are normal. Right eye exhibits no discharge. Left eye exhibits no discharge.  Neck: Neck supple.  Cardiovascular: Normal rate, regular rhythm and normal heart sounds.  HR~100  Pulmonary/Chest: Effort normal and breath sounds normal.  Abdominal: Soft. There is no tenderness.  Neurological: She is alert and oriented to person, place, and time.  CN 3-12  grossly intact. 5/5 strength in all 4 extremities. Grossly normal sensation. Normal finger to nose.   Skin: Skin is warm and dry. She is not diaphoretic.  Nursing note and vitals reviewed.    ED Treatments / Results  Labs (all labs ordered are listed, but only abnormal results are displayed) Labs Reviewed  CBC WITH DIFFERENTIAL/PLATELET - Abnormal; Notable for the following components:      Result Value   WBC 19.6 (*)    HCT 35.4 (*)    Neutro Abs 17.2 (*)  Monocytes Absolute 1.1 (*)    All other components within normal limits  COMPREHENSIVE METABOLIC PANEL - Abnormal; Notable for the following components:   Sodium 127 (*)    Chloride 90 (*)    Glucose, Bld 200 (*)    BUN 26 (*)    GFR calc non Af Amer 52 (*)    All other components within normal limits  MAGNESIUM - Abnormal; Notable for the following components:   Magnesium 1.1 (*)    All other components within normal limits  TROPONIN I  URINALYSIS, ROUTINE W REFLEX MICROSCOPIC  SODIUM, URINE, RANDOM  OSMOLALITY  I-STAT CG4 LACTIC ACID, ED    EKG  EKG Interpretation  Date/Time:  Monday October 25 2017 17:46:25 EST Ventricular Rate:  104 PR Interval:    QRS Duration: 90 QT Interval:  324 QTC Calculation: 427 R Axis:     Text Interpretation:  Sinus tachycardia Nonspecific T abnormalities, lateral leads T wave changes new since Jan 2019 Confirmed by Sherwood Gambler [phone]) on 10/25/2017 6:04:52 PM       Radiology Dg Chest 2 View  Result Date: 10/25/2017 CLINICAL DATA:  Weakness EXAM: CHEST  2 VIEW COMPARISON:  05/31/2017 FINDINGS: The heart size and mediastinal contours are within normal limits. Both lungs are clear. The visualized skeletal structures are unremarkable. IMPRESSION: No active cardiopulmonary disease. Electronically Signed   By: Rolm Baptise M.D.   On: 10/25/2017 21:42    Procedures Procedures (including critical care time)  Medications Ordered in ED Medications  0.9 %  sodium chloride  infusion (not administered)  magnesium sulfate IVPB 2 g 50 mL (0 g Intravenous Stopped 10/25/17 2049)     Initial Impression / Assessment and Plan / ED Course  I have reviewed the triage vital signs and the nursing notes.  Pertinent labs & imaging results that were available during my care of the patient were reviewed by me and considered in my medical decision making (see chart for details).     Sodium is downtrending.  Last week it was 129 and today is 127.  Last month was 133.  Given progressive weakness and on and off headaches with fatigue I think this is symptomatic.  It is moderate.  She also has hypomagnesemia and will be repleted with IV.  Started on normal saline at maintenance.  Causes likely hypovolemia and poor p.o. intake.  Given that she is still having vomiting and swallowing issues I do not think is going to turn around at home and that she will need admission for observation and further treatment.  Discussed with Dr. Tamala Julian who will admit and transfer to [facility]. She is noted to be on and off tachypneic but no chest symptoms. CXR will be obtained.  Final Clinical Impressions(s) / ED Diagnoses   Final diagnoses:  Hyponatremia  Hypomagnesemia    ED Discharge Orders        Ordered    amoxicillin-clavulanate (AUGMENTIN) 875-125 MG tablet  2 times daily,   Status:  Discontinued     10/25/17 2114       Sherwood Gambler, MD 10/26/17 0020

## 2017-10-25 NOTE — Plan of Care (Signed)
Discussed with Dr. Regenia Skeeter from [facility]. Ms. Lehane is a 83 year old female with pmh of HTN, DM type II, asthma, breast cancer, GERD; who presents with weakness and chronic dysphasia.  Patient was said to have EGD later this week.  Labs revealed WBC 19.6(appears to be somewhat chronic), sodium 127, magnesium 1.1 BUN 26, creatinine 1.  Patient was given 2 g of magnesium sulfate and started on normal saline IV fluids at 75 mL/h.  Urinalysis, urine sodium, and osmolarity pending.  TRH called to admit.  Admit to a telemetry bed at [facility].

## 2017-10-25 NOTE — ED Notes (Signed)
2 attempts at IV  - both attempts unsucceful - pressure dressings applied

## 2017-10-25 NOTE — ED Notes (Signed)
Called PALS Line for follow up.

## 2017-10-26 DIAGNOSIS — E039 Hypothyroidism, unspecified: Secondary | ICD-10-CM | POA: Diagnosis not present

## 2017-10-26 DIAGNOSIS — Z79899 Other long term (current) drug therapy: Secondary | ICD-10-CM | POA: Diagnosis not present

## 2017-10-26 DIAGNOSIS — E871 Hypo-osmolality and hyponatremia: Secondary | ICD-10-CM | POA: Diagnosis present

## 2017-10-26 DIAGNOSIS — E876 Hypokalemia: Secondary | ICD-10-CM | POA: Diagnosis not present

## 2017-10-26 DIAGNOSIS — I10 Essential (primary) hypertension: Secondary | ICD-10-CM | POA: Diagnosis not present

## 2017-10-26 DIAGNOSIS — F329 Major depressive disorder, single episode, unspecified: Secondary | ICD-10-CM | POA: Diagnosis not present

## 2017-10-26 DIAGNOSIS — R1310 Dysphagia, unspecified: Secondary | ICD-10-CM | POA: Diagnosis not present

## 2017-10-26 DIAGNOSIS — K219 Gastro-esophageal reflux disease without esophagitis: Secondary | ICD-10-CM | POA: Diagnosis not present

## 2017-10-26 DIAGNOSIS — R9431 Abnormal electrocardiogram [ECG] [EKG]: Secondary | ICD-10-CM | POA: Diagnosis not present

## 2017-10-26 DIAGNOSIS — Z87891 Personal history of nicotine dependence: Secondary | ICD-10-CM | POA: Diagnosis not present

## 2017-10-26 DIAGNOSIS — R112 Nausea with vomiting, unspecified: Secondary | ICD-10-CM | POA: Diagnosis not present

## 2017-10-26 DIAGNOSIS — F0390 Unspecified dementia without behavioral disturbance: Secondary | ICD-10-CM | POA: Diagnosis not present

## 2017-10-26 DIAGNOSIS — N390 Urinary tract infection, site not specified: Secondary | ICD-10-CM | POA: Diagnosis not present

## 2017-10-26 DIAGNOSIS — E119 Type 2 diabetes mellitus without complications: Secondary | ICD-10-CM | POA: Diagnosis not present

## 2017-10-26 DIAGNOSIS — R262 Difficulty in walking, not elsewhere classified: Secondary | ICD-10-CM | POA: Diagnosis not present

## 2017-10-26 DIAGNOSIS — G2581 Restless legs syndrome: Secondary | ICD-10-CM | POA: Diagnosis not present

## 2017-10-26 DIAGNOSIS — N179 Acute kidney failure, unspecified: Secondary | ICD-10-CM | POA: Diagnosis not present

## 2017-10-26 DIAGNOSIS — B370 Candidal stomatitis: Secondary | ICD-10-CM | POA: Diagnosis not present

## 2017-10-26 DIAGNOSIS — R531 Weakness: Secondary | ICD-10-CM | POA: Diagnosis not present

## 2017-10-26 DIAGNOSIS — Z853 Personal history of malignant neoplasm of breast: Secondary | ICD-10-CM | POA: Diagnosis not present

## 2017-10-26 DIAGNOSIS — E860 Dehydration: Secondary | ICD-10-CM | POA: Diagnosis not present

## 2017-10-26 DIAGNOSIS — Z9010 Acquired absence of unspecified breast and nipple: Secondary | ICD-10-CM | POA: Diagnosis not present

## 2017-10-26 DIAGNOSIS — E785 Hyperlipidemia, unspecified: Secondary | ICD-10-CM | POA: Diagnosis not present

## 2017-10-26 LAB — URINALYSIS, ROUTINE W REFLEX MICROSCOPIC
BILIRUBIN URINE: NEGATIVE
Glucose, UA: NEGATIVE mg/dL
Ketones, ur: 15 mg/dL — AB
Nitrite: POSITIVE — AB
PH: 5.5 (ref 5.0–8.0)
Protein, ur: 30 mg/dL — AB
SPECIFIC GRAVITY, URINE: 1.015 (ref 1.005–1.030)

## 2017-10-26 LAB — URINALYSIS, MICROSCOPIC (REFLEX)

## 2017-10-26 LAB — BASIC METABOLIC PANEL
Anion gap: 11 (ref 5–15)
BUN: 18 mg/dL (ref 6–20)
CHLORIDE: 102 mmol/L (ref 101–111)
CO2: 24 mmol/L (ref 22–32)
Calcium: 8.8 mg/dL — ABNORMAL LOW (ref 8.9–10.3)
Creatinine, Ser: 0.77 mg/dL (ref 0.44–1.00)
GFR calc non Af Amer: >60 mL/min (ref >60)
Glucose, Bld: 161 mg/dL — ABNORMAL HIGH (ref 65–99)
POTASSIUM: 4.7 mmol/L (ref 3.5–5.1)
SODIUM: 137 mmol/L (ref 135–145)

## 2017-10-26 LAB — CBG MONITORING, ED: GLUCOSE-CAPILLARY: 162 mg/dL — AB (ref 65–99)

## 2017-10-26 LAB — SODIUM, URINE, RANDOM: Sodium, Ur: 26 mmol/L

## 2017-10-26 LAB — GLUCOSE, CAPILLARY: Glucose-Capillary: 214 mg/dL — ABNORMAL HIGH (ref 65–99)

## 2017-10-26 LAB — CBC
HCT: 32.3 % — ABNORMAL LOW (ref 36.0–46.0)
HEMOGLOBIN: 10.9 g/dL — AB (ref 12.0–15.0)
MCH: 32 pg (ref 26.0–34.0)
MCHC: 33.7 g/dL (ref 30.0–36.0)
MCV: 94.7 fL (ref 78.0–100.0)
Platelets: 256 10*3/uL (ref 150–400)
RBC: 3.41 MIL/uL — AB (ref 3.87–5.11)
RDW: 14.6 % (ref 11.5–15.5)
WBC: 14.7 10*3/uL — ABNORMAL HIGH (ref 4.0–10.5)

## 2017-10-26 LAB — TROPONIN I: Troponin I: <0.03 ng/mL (ref <0.03)

## 2017-10-26 MED ORDER — METOPROLOL TARTRATE 50 MG PO TABS
50.0000 mg | ORAL_TABLET | Freq: Two times a day (BID) | ORAL | Status: DC
  Administered 2017-10-26 – 2017-10-27 (×2): 50 mg via ORAL
  Filled 2017-10-26 (×2): qty 1

## 2017-10-26 MED ORDER — LEVOTHYROXINE SODIUM 50 MCG PO TABS
50.0000 ug | ORAL_TABLET | Freq: Every day | ORAL | Status: DC
  Administered 2017-10-27: 50 ug via ORAL
  Filled 2017-10-26: qty 1

## 2017-10-26 MED ORDER — ONDANSETRON HCL 4 MG PO TABS: 4.0000 mg | ORAL_TABLET | Freq: Four times a day (QID) | ORAL | Status: DC | PRN

## 2017-10-26 MED ORDER — DULOXETINE HCL 30 MG PO CPEP
30.0000 mg | ORAL_CAPSULE | Freq: Every day | ORAL | Status: DC
  Administered 2017-10-26 – 2017-10-27 (×2): 30 mg via ORAL
  Filled 2017-10-26 (×2): qty 1

## 2017-10-26 MED ORDER — PANTOPRAZOLE SODIUM 40 MG PO TBEC
80.0000 mg | DELAYED_RELEASE_TABLET | Freq: Every day | ORAL | Status: DC
  Administered 2017-10-26: 80 mg via ORAL
  Filled 2017-10-26: qty 2

## 2017-10-26 MED ORDER — FESOTERODINE FUMARATE ER 8 MG PO TB24
8.0000 mg | ORAL_TABLET | Freq: Every day | ORAL | Status: DC
Start: 2017-10-26 — End: 2017-10-27
  Administered 2017-10-26 – 2017-10-27 (×2): 8 mg via ORAL
  Filled 2017-10-26 (×2): qty 1

## 2017-10-26 MED ORDER — SODIUM CHLORIDE 0.9 % IV SOLN
1.0000 g | INTRAVENOUS | Status: DC
  Administered 2017-10-26: 1 g via INTRAVENOUS
  Filled 2017-10-26: qty 1
  Filled 2017-10-26: qty 10

## 2017-10-26 MED ORDER — ATORVASTATIN CALCIUM 10 MG PO TABS
10.0000 mg | ORAL_TABLET | Freq: Every day | ORAL | Status: DC
  Administered 2017-10-26 – 2017-10-27 (×2): 10 mg via ORAL
  Filled 2017-10-26 (×2): qty 1

## 2017-10-26 MED ORDER — ENSURE ENLIVE PO LIQD
237.0000 mL | Freq: Two times a day (BID) | ORAL | Status: DC
  Administered 2017-10-27 (×2): 237 mL via ORAL

## 2017-10-26 MED ORDER — INSULIN ASPART 100 UNIT/ML ~~LOC~~ SOLN
0.0000 [IU] | Freq: Three times a day (TID) | SUBCUTANEOUS | Status: DC
  Administered 2017-10-27: 1 [IU] via SUBCUTANEOUS
  Administered 2017-10-27: 2 [IU] via SUBCUTANEOUS
  Administered 2017-10-27: 1 [IU] via SUBCUTANEOUS

## 2017-10-26 MED ORDER — DONEPEZIL HCL 10 MG PO TABS
10.0000 mg | ORAL_TABLET | Freq: Every day | ORAL | Status: DC
  Administered 2017-10-26: 10 mg via ORAL
  Filled 2017-10-26: qty 1

## 2017-10-26 MED ORDER — MONTELUKAST SODIUM 10 MG PO TABS
10.0000 mg | ORAL_TABLET | Freq: Every day | ORAL | Status: DC
  Administered 2017-10-26: 10 mg via ORAL
  Filled 2017-10-26: qty 1

## 2017-10-26 MED ORDER — SODIUM CHLORIDE 0.9 % IV SOLN
INTRAVENOUS | Status: DC
  Administered 2017-10-26: 21:00:00 via INTRAVENOUS

## 2017-10-26 MED ORDER — ENOXAPARIN SODIUM 40 MG/0.4ML ~~LOC~~ SOLN
40.0000 mg | SUBCUTANEOUS | Status: DC
  Administered 2017-10-26: 40 mg via SUBCUTANEOUS
  Filled 2017-10-26: qty 0.4

## 2017-10-26 MED ORDER — MEMANTINE HCL 10 MG PO TABS
10.0000 mg | ORAL_TABLET | Freq: Two times a day (BID) | ORAL | Status: DC
  Administered 2017-10-26 – 2017-10-27 (×2): 10 mg via ORAL
  Filled 2017-10-26 (×2): qty 1

## 2017-10-26 MED ORDER — ACETAMINOPHEN 650 MG RE SUPP: 650.0000 mg | Freq: Four times a day (QID) | RECTAL | Status: DC | PRN

## 2017-10-26 MED ORDER — ONDANSETRON HCL 4 MG/2ML IJ SOLN: 4.0000 mg | Freq: Four times a day (QID) | INTRAMUSCULAR | Status: DC | PRN

## 2017-10-26 MED ORDER — ROPINIROLE HCL 1 MG PO TABS
1.0000 mg | ORAL_TABLET | Freq: Every day | ORAL | Status: DC
  Administered 2017-10-26: 1 mg via ORAL
  Filled 2017-10-26: qty 1

## 2017-10-26 MED ORDER — LORATADINE 10 MG PO TABS
10.0000 mg | ORAL_TABLET | Freq: Every day | ORAL | Status: DC
  Administered 2017-10-26 – 2017-10-27 (×2): 10 mg via ORAL
  Filled 2017-10-26 (×2): qty 1

## 2017-10-26 MED ORDER — ACETAMINOPHEN 325 MG PO TABS: 650.0000 mg | ORAL_TABLET | Freq: Four times a day (QID) | ORAL | Status: DC | PRN

## 2017-10-26 MED ORDER — TRAMADOL HCL 50 MG PO TABS: 50.0000 mg | ORAL_TABLET | Freq: Four times a day (QID) | ORAL | Status: DC | PRN

## 2017-10-26 MED ORDER — TRAMADOL HCL 50 MG PO TABS: 50.0000 mg | ORAL_TABLET | Freq: Every day | ORAL | Status: DC | PRN

## 2017-10-26 NOTE — H&P (Signed)
History and Physical    Adriana Sanchez HEN:[id number] DOB: 1934-09-25 DOA: 10/25/2017  PCP: Garlan Fillers, MD  Patient coming from: [facility]   Chief Complaint: weakness and dysphagia   HPI: Adriana Sanchez is a 83 y.o. female with medical history significant of HTN, DM, asthma, breast cancer, GERD who presents with weakness and chronic dysphagia. She states that in January she was treated for similar issues (she thinks UTI and C Diff). Record reviewed and it appears she was admitted on 09/29/17 with chief complaint of weakness, decreased PO intake, diarrhea and was admitted for hypokalemia and hypomagnesemia. She was discharged to SNF after admission. This time, patient admits to weakness, poor appetite with her last meal 2 days ago, "gnawing" abdominal discomfort that has been ongoing for 2 months, nausea, vomiting, dysuria. She denies any fevers, chest pain, or diarrhea.   ED Course: Labs revealed WBC 19.6, Na 127, Mag 1.1, BUN 26, Cr 1. She was given 2g magnesium sulfate, started on IVF NS and transferred to [facility].   Review of Systems: As per HPI otherwise 10 point review of systems negative.   Past Medical History:  Diagnosis Date  . Arthritis   . Asthma   . Breast cancer ([place])   . Dementia   . Diabetes mellitus without complication ([place])   . GERD (gastroesophageal reflux disease)   . Hypertension   . Irregular heartbeat   . Thyroid disease     Past Surgical History:  Procedure Laterality Date  . CHOLECYSTECTOMY    . MASTECTOMY       reports that she has quit smoking. she has never used smokeless tobacco. She reports that she does not drink alcohol or use drugs.  Allergies  Allergen Reactions  . Aspirin Other (See Comments)    bruising  . Moexipril Other (See Comments)    unknown  . Simvastatin Other (See Comments)    Myalgias (intolerance)  . Sulfa Antibiotics Other (See Comments)    unknown    No family history on file.   Prior to Admission medications   Medication Sig  Start Date End Date Taking? Authorizing Provider  acidophilus (RISAQUAD) CAPS capsule Take 1 capsule by mouth daily.    [provider]  albuterol (ACCUNEB) 1.25 MG/3ML nebulizer solution Take 1 ampule by nebulization every 6 (six) hours as needed for wheezing.    [provider]  atorvastatin (LIPITOR) 10 MG tablet Take 10 mg by mouth daily.    [provider]  calcium-vitamin D (OSCAL WITH D) 250-125 MG-UNIT tablet Take 1 tablet by mouth daily.    [provider]  donepezil (ARICEPT) 10 MG tablet Take 10 mg by mouth at bedtime.    [provider]  DULoxetine (CYMBALTA) 30 MG capsule Take 30 mg by mouth daily.    [provider]  fluticasone furoate-vilanterol (BREO ELLIPTA) 100-25 MCG/INH AEPB Inhale 1 puff into the lungs daily.    [provider]  furosemide (LASIX) 20 MG tablet Take 20 mg by mouth daily.     [provider]  ketoconazole (NIZORAL) 2 % cream Apply 1 application topically daily.    [provider]  levothyroxine (SYNTHROID, LEVOTHROID) 50 MCG tablet Take 50 mcg by mouth daily before breakfast.    [provider]  loratadine (CLARITIN) 10 MG tablet Take 10 mg by mouth daily.    [provider]  magnesium 30 MG tablet Take 30 mg by mouth daily.     [provider]  memantine (  NAMENDA) 10 MG tablet Take 10 mg by mouth 2 (two) times daily.    [provider]  metFORMIN (GLUCOPHAGE) 500 MG tablet Take 1,000 mg by mouth every evening.     [provider]  metoprolol tartrate (LOPRESSOR) 50 MG tablet Take 50 mg by mouth 2 (two) times daily.    [provider]  montelukast (SINGULAIR) 10 MG tablet Take 10 mg by mouth at bedtime.    [provider]  omeprazole (PRILOSEC) 40 MG capsule Take 40 mg by mouth daily.    [provider]  potassium gluconate 595 (99 K) MG TABS tablet Take 595 mg by mouth daily.    [provider]    rOPINIRole (REQUIP) 1 MG tablet Take 1 mg by mouth at bedtime.    [provider]  telmisartan (MICARDIS) 40 MG tablet Take 40 mg by mouth daily.    [provider]  tolterodine (DETROL LA) 4 MG 24 hr capsule Take 4 mg by mouth daily.    [provider]  traMADol (ULTRAM) 50 MG tablet Take 50 mg by mouth daily as needed for moderate pain.     [provider]  vitamin C (ASCORBIC ACID) 500 MG tablet Take 500 mg by mouth daily.    [provider]  Zinc Sulfate (ZINC 15 PO) Take 2 capsules by mouth every evening.     [provider]    Physical Exam: Vitals:   10/26/17 1500 10/26/17 1600 10/26/17 1741 10/26/17 1748  BP: 140/68 (!) 104/53 (!) 132/58   Pulse: 89 93 85   Resp: 16 18    Temp:   99.1 F (37.3 C)   TempSrc:   Oral   SpO2: 97% 92% 96%   Weight:    58.3 kg (128 lb 8.5 oz)  Height:    4\' 11"  (1.499 m)     Constitutional: NAD, calm, comfortable Eyes: PERRL, lids and conjunctivae normal ENMT: Mucous membranes are dry with white plaque. Posterior pharynx clear of any exudate or lesions.Normal dentition.  Neck: normal, supple, no masses, no thyromegaly Respiratory: clear to auscultation bilaterally, no wheezing, no crackles. Normal respiratory effort. No accessory muscle use.  Cardiovascular: Regular rate and rhythm, no murmurs / rubs / gallops. No extremity edema.  Abdomen: no tenderness, no masses palpated. No hepatosplenomegaly. Bowel sounds positive.  Musculoskeletal: no clubbing / cyanosis. No joint deformity upper and lower extremities. Good ROM, no contractures. Normal muscle tone.  Skin: no rashes, lesions, ulcers. No induration Neurologic: Alert and oriented, nonfocal neurologic exam  Psychiatric: Normal judgment and insight. Alert and oriented. Normal mood.   Labs on Admission: I have personally reviewed following labs and imaging studies  CBC: Recent Labs  Lab 10/25/17 1759  WBC 19.6*  NEUTROABS 17.2*  HGB  12.4  HCT 35.4*  MCV 91.2  PLT 433   Basic Metabolic Panel: Recent Labs  Lab 10/25/17 1759  NA 127*  K 4.2  CL 90*  CO2 23  GLUCOSE 200*  BUN 26*  CREATININE 1.00  CALCIUM 8.9  MG 1.1*   GFR: Estimated Creatinine Clearance: 34.9 mL/min (by C-G formula based on SCr of 1 mg/dL). Liver Function Tests: Recent Labs  Lab 10/25/17 1759  AST 27  ALT 29  ALKPHOS 56  BILITOT 0.9  PROT 7.8  ALBUMIN 3.7   No results for input(s): LIPASE, AMYLASE in the last 168 hours. No results for input(s): AMMONIA in the last 168 hours. Coagulation Profile: No results for input(s):  INR, PROTIME in the last 168 hours. Cardiac Enzymes: Recent Labs  Lab 10/25/17 1759  TROPONINI <0.03   BNP (last 3 results) No results for input(s): PROBNP in the last 8760 hours. HbA1C: No results for input(s): HGBA1C in the last 72 hours. CBG: Recent Labs  Lab 10/26/17 1627  GLUCAP 162*   Lipid Profile: No results for input(s): CHOL, HDL, LDLCALC, TRIG, CHOLHDL, LDLDIRECT in the last 72 hours. Thyroid Function Tests: No results for input(s): TSH, T4TOTAL, FREET4, T3FREE, THYROIDAB in the last 72 hours. Anemia Panel: No results for input(s): VITAMINB12, FOLATE, FERRITIN, TIBC, IRON, RETICCTPCT in the last 72 hours. Urine analysis:    Component Value Date/Time   COLORURINE YELLOW 10/26/2017 1435   APPEARANCEUR CLOUDY (A) 10/26/2017 1435   LABSPEC 1.015 10/26/2017 1435   PHURINE 5.5 10/26/2017 1435   GLUCOSEU NEGATIVE 10/26/2017 1435   HGBUR MODERATE (A) 10/26/2017 1435   BILIRUBINUR NEGATIVE 10/26/2017 1435   KETONESUR 15 (A) 10/26/2017 1435   PROTEINUR 30 (A) 10/26/2017 1435   NITRITE POSITIVE (A) 10/26/2017 1435   LEUKOCYTESUR MODERATE (A) 10/26/2017 1435   Sepsis Labs: !!!!!!!!!!!!!!!!!!!!!!!!!!!!!!!!!!!!!!!!!!!! @LABRCNTIP (procalcitonin:4,lacticidven:4) )No results found for this or any previous visit (from the past 240 hour(s)).   Radiological Exams on Admission: Dg Chest 2  View  Result Date: 10/25/2017 CLINICAL DATA:  Weakness EXAM: CHEST  2 VIEW COMPARISON:  05/31/2017 FINDINGS: The heart size and mediastinal contours are within normal limits. Both lungs are clear. The visualized skeletal structures are unremarkable. IMPRESSION: No active cardiopulmonary disease. Electronically Signed   By: Rolm Baptise M.D.   On: 10/25/2017 21:42    EKG: Independently reviewed. Sinus tachycardia with T wave inversion anterior-lateral leads.   Assessment/Plan Active Problems:   Hyponatremia  Hyponatremia -Likely due to poor PO intake  -Hold lasix, micardis  -IVF -Repeat BMP now  UTI, POA -UA with moderate leukocytes, positive nitrite, many bacteria, numerous WBC -Urine culture -Rocephin   Hypomagnesemia -Replaced in ED, repeat lab in AM  T wave inversion -New T wave inversion anterior-lateral leads since Jan 2019. Initial trop negative. Patient without chest pain. Repeat trop now and repeat EKG in AM   Weakness -PT eval   HLD -Continue lipitor  Dementia -Continue aricept/namenda   Depression -Continue cymbalta  Hypothyroidism -Check TSH, free T4 -Continue synthroid  DM  -SSI. Hold metformin   HTN -Continue lopressor   GERD -Continue PPI   RLS -Continue requip    DVT prophylaxis: Lovenox Code Status: Full   Family Communication: No family at bedside  Disposition Plan: Pending improvement  Consults called: None  Admission status: Observation    Severity of Illness: The appropriate patient status for this patient is OBSERVATION. Observation status is judged to be reasonable and necessary in order to provide the required intensity of service to ensure the patient's safety. The patient's presenting symptoms, physical exam findings, and initial radiographic and laboratory data in the context of their medical condition is felt to place them at decreased risk for further clinical deterioration. Furthermore, it is anticipated that the patient  will be medically stable for discharge from the hospital within 2 midnights of admission.   Dessa Phi, DO [facility] [email or web address] Password [email or web address] 10/26/2017, 6:18 PM

## 2017-10-26 NOTE — Plan of Care (Signed)
  Elimination: Will not experience complications related to urinary retention 10/26/2017 2137 - Progressing by Ashley Murrain, RN   Clinical Measurements: Diagnostic test results will improve 10/26/2017 2137 - Progressing by Ashley Murrain, RN

## 2017-10-27 ENCOUNTER — Observation Stay (HOSPITAL_COMMUNITY): Payer: Medicare Other

## 2017-10-27 DIAGNOSIS — E871 Hypo-osmolality and hyponatremia: Secondary | ICD-10-CM | POA: Diagnosis not present

## 2017-10-27 DIAGNOSIS — E876 Hypokalemia: Secondary | ICD-10-CM | POA: Diagnosis not present

## 2017-10-27 DIAGNOSIS — N179 Acute kidney failure, unspecified: Secondary | ICD-10-CM | POA: Diagnosis not present

## 2017-10-27 LAB — BASIC METABOLIC PANEL
Anion gap: 10 (ref 5–15)
BUN: 15 mg/dL (ref 6–20)
CO2: 24 mmol/L (ref 22–32)
CREATININE: 0.69 mg/dL (ref 0.44–1.00)
Calcium: 8.2 mg/dL — ABNORMAL LOW (ref 8.9–10.3)
Chloride: 104 mmol/L (ref 101–111)
GFR calc Af Amer: >60 mL/min (ref >60)
GFR calc non Af Amer: >60 mL/min (ref >60)
Glucose, Bld: 163 mg/dL — ABNORMAL HIGH (ref 65–99)
Potassium: 4.2 mmol/L (ref 3.5–5.1)
SODIUM: 138 mmol/L (ref 135–145)

## 2017-10-27 LAB — GLUCOSE, CAPILLARY
GLUCOSE-CAPILLARY: 184 mg/dL — AB (ref 65–99)
GLUCOSE-CAPILLARY: 141 mg/dL — AB (ref 65–99)
Glucose-Capillary: 142 mg/dL — ABNORMAL HIGH (ref 65–99)

## 2017-10-27 LAB — T4, FREE: Free T4: 1.24 ng/dL — ABNORMAL HIGH (ref 0.61–1.12)

## 2017-10-27 LAB — CBC
HCT: 29.5 % — ABNORMAL LOW (ref 36.0–46.0)
HEMOGLOBIN: 10.4 g/dL — AB (ref 12.0–15.0)
MCH: 33.4 pg (ref 26.0–34.0)
MCHC: 35.3 g/dL (ref 30.0–36.0)
MCV: 94.9 fL (ref 78.0–100.0)
Platelets: 233 10*3/uL (ref 150–400)
RBC: 3.11 MIL/uL — ABNORMAL LOW (ref 3.87–5.11)
RDW: 14.9 % (ref 11.5–15.5)
WBC: 11.5 10*3/uL — ABNORMAL HIGH (ref 4.0–10.5)

## 2017-10-27 LAB — MAGNESIUM
Magnesium: 1.5 mg/dL — ABNORMAL LOW (ref 1.7–2.4)
Magnesium: 2.2 mg/dL (ref 1.7–2.4)

## 2017-10-27 LAB — HEMOGLOBIN A1C
HEMOGLOBIN A1C: 8 % — AB (ref 4.8–5.6)
MEAN PLASMA GLUCOSE: 182.9 mg/dL

## 2017-10-27 LAB — TSH: TSH: 1.473 u[IU]/mL (ref 0.350–4.500)

## 2017-10-27 MED ORDER — FUROSEMIDE 20 MG PO TABS: 20.0000 mg | ORAL_TABLET | Freq: Every day | ORAL | 0 refills | Status: AC | PRN

## 2017-10-27 MED ORDER — CEPHALEXIN 500 MG PO CAPS: 500.0000 mg | ORAL_CAPSULE | Freq: Two times a day (BID) | ORAL | 0 refills | Status: AC

## 2017-10-27 MED ORDER — POLYETHYLENE GLYCOL 3350 17 G PO PACK: 17.0000 g | PACK | Freq: Every day | ORAL | 0 refills | Status: AC

## 2017-10-27 MED ORDER — FLUCONAZOLE 100 MG PO TABS: 100.0000 mg | ORAL_TABLET | Freq: Every day | ORAL | 0 refills | Status: AC

## 2017-10-27 MED ORDER — MAGNESIUM SULFATE 2 GM/50ML IV SOLN
2.0000 g | Freq: Once | INTRAVENOUS | Status: AC
  Administered 2017-10-27: 2 g via INTRAVENOUS
  Filled 2017-10-27: qty 50

## 2017-10-27 MED ORDER — POLYETHYLENE GLYCOL 3350 17 G PO PACK
17.0000 g | PACK | Freq: Every day | ORAL | Status: DC
  Administered 2017-10-27: 17 g via ORAL
  Filled 2017-10-27: qty 1

## 2017-10-27 MED ORDER — OMEPRAZOLE 40 MG PO CPDR: 40.0000 mg | DELAYED_RELEASE_CAPSULE | Freq: Two times a day (BID) | ORAL | 0 refills | Status: AC

## 2017-10-27 MED ORDER — GLIMEPIRIDE 1 MG PO TABS: 1.0000 mg | ORAL_TABLET | ORAL | 0 refills | Status: AC

## 2017-10-27 MED ORDER — FLUCONAZOLE 100 MG PO TABS
200.0000 mg | ORAL_TABLET | Freq: Every day | ORAL | Status: AC
  Administered 2017-10-27: 200 mg via ORAL
  Filled 2017-10-27: qty 2

## 2017-10-27 MED ORDER — ENSURE ENLIVE PO LIQD: 237.0000 mL | Freq: Two times a day (BID) | ORAL | 12 refills | Status: AC

## 2017-10-27 MED ORDER — FLUCONAZOLE 100 MG PO TABS: 100.0000 mg | ORAL_TABLET | Freq: Every day | ORAL | Status: DC

## 2017-10-27 MED ORDER — DOCUSATE SODIUM 100 MG PO CAPS
100.0000 mg | ORAL_CAPSULE | Freq: Two times a day (BID) | ORAL | Status: DC
  Administered 2017-10-27: 100 mg via ORAL
  Filled 2017-10-27: qty 1

## 2017-10-27 MED ORDER — PANTOPRAZOLE SODIUM 40 MG PO TBEC
40.0000 mg | DELAYED_RELEASE_TABLET | Freq: Two times a day (BID) | ORAL | Status: DC
  Administered 2017-10-27 (×2): 40 mg via ORAL
  Filled 2017-10-27 (×2): qty 1

## 2017-10-27 NOTE — Care Management Note (Signed)
Case Management Note  Patient Details  Name: Adriana Sanchez MRN: [id number] Date of Birth: April 25, 1935  Subjective/Objective:                    Action/Plan:   Expected Discharge Date:  10/27/17               Expected Discharge Plan:  [place]  In-House Referral:     Discharge planning Services  CM Consult  Post Acute Care Choice:    Choice offered to:     DME Arranged:    DME Agency:     HH Arranged:  PT HH Agency:  [place]  Status of Service:  Completed, signed off  If discussed at [place] of Stay Meetings, dates discussed:    Additional Comments:  Dessa Phi, RN 10/27/2017, 3:32 PM

## 2017-10-27 NOTE — Care Management Note (Signed)
Case Management Note  Patient Details  Name: Adriana Sanchez MRN: [id number] Date of Birth: [birth year]  Subjective/Objective:  PT recc HHPT[facility] chosen by dtr per nsg-[facility] rep Tommi Rumps aware of d/c, & HHC orders.  No further CM needs.                Action/Plan:d/c home w/HHC.   Expected Discharge Date:  10/27/17               Expected Discharge Plan:  [place]  In-House Referral:     Discharge planning Services  CM Consult  Post Acute Care Choice:    Choice offered to:     DME Arranged:    DME Agency:     HH Arranged:  PT HH Agency:  [place]  Status of Service:  Completed, signed off  If discussed at [place] of Stay Meetings, dates discussed:    Additional Comments:  Dessa Phi, RN 10/27/2017, 3:31 PM

## 2017-10-27 NOTE — Evaluation (Signed)
Physical Therapy Evaluation Patient Details Name: Adriana Sanchez MRN: [id number] DOB: Jan 01, 1935 Today's Date: 10/27/2017   History of Present Illness  83 y.o. female with past medical history significant for arthritis, thyroid disease, diabetes, dementia and asthma and admitted with generalized weakness and hyponatremia  Clinical Impression  Pt admitted as above and presenting with functional mobility limitations 2* generalized weakness and mild ambulatory balance deficits.  Pt should progress to dc home with intermittent assist of family.    Follow Up Recommendations Home health PT    Equipment Recommendations  None recommended by PT    Recommendations for Other Services OT consult     Precautions / Restrictions Precautions Precautions: Fall Restrictions Weight Bearing Restrictions: No      Mobility  Bed Mobility Overal bed mobility: Needs Assistance Bed Mobility: Supine to Sit     Supine to sit: Supervision     General bed mobility comments: Pt unassisted to EOB but with increased time and utilizing bed rail  Transfers Overall transfer level: Needs assistance Equipment used: Rolling walker (2 wheeled) Transfers: Sit to/from Stand Sit to Stand: Min guard         General transfer comment: cues for safe transition position and use of UEs to self assist  Ambulation/Gait Ambulation/Gait assistance: Min assist;Min guard Ambulation Distance (Feet): 222 Feet Assistive device: Rolling walker (2 wheeled) Gait Pattern/deviations: Step-through pattern;Decreased step length - right;Decreased step length - left;Shuffle;Trunk flexed Gait velocity: decr Gait velocity interpretation: Below normal speed for age/gender General Gait Details: cues for posture and position from [employer]            Wheelchair Mobility    Modified Rankin (Stroke Patients Only)       Balance Overall balance assessment: Needs assistance Sitting-balance support: No upper extremity  supported;Feet supported Sitting balance-Leahy Scale: Good     Standing balance support: No upper extremity supported Standing balance-Leahy Scale: Fair                               Pertinent Vitals/Pain Pain Assessment: No/denies pain    Home Living Family/patient expects to be discharged to:: Private residence Living Arrangements: Alone Available Help at Discharge: Available PRN/intermittently;Family Type of Home: Apartment Home Access: Level entry     Home Layout: One level Home Equipment: Walker - 4 wheels      Prior Function Level of Independence: Independent with assistive device(s)               Hand Dominance        Extremity/Trunk Assessment   Upper Extremity Assessment Upper Extremity Assessment: Generalized weakness    Lower Extremity Assessment Lower Extremity Assessment: Generalized weakness    Cervical / Trunk Assessment Cervical / Trunk Assessment: Kyphotic  Communication   Communication: No difficulties  Cognition Arousal/Alertness: Awake/alert Behavior During Therapy: WFL for tasks assessed/performed Overall Cognitive Status: Within Functional Limits for tasks assessed                                        General Comments      Exercises     Assessment/Plan    PT Assessment Patient needs continued PT services  PT Problem List Decreased strength;Decreased activity tolerance;Decreased balance;Decreased mobility;Decreased knowledge of use of DME;Pain;Decreased safety awareness       PT Treatment Interventions DME instruction;Gait training;Stair training;Functional mobility  training;Therapeutic activities;Therapeutic exercise;Patient/family education    PT Goals (Current goals can be found in the Care Plan section)  Acute Rehab PT Goals Patient Stated Goal: Regain IND and return home PT Goal Formulation: With patient Time For Goal Achievement: 11/03/17 Potential to Achieve Goals: Good     Frequency Min 3X/week   Barriers to discharge Decreased caregiver support Home alone    Co-evaluation               AM-PAC PT "6 Clicks" Daily Activity  Outcome Measure Difficulty turning over in bed (including adjusting bedclothes, sheets and blankets)?: A Little Difficulty moving from lying on back to sitting on the side of the bed? : A Lot Difficulty sitting down on and standing up from a chair with arms (e.g., wheelchair, bedside commode, etc,.)?: A Lot Help needed moving to and from a bed to chair (including a wheelchair)?: A Little Help needed walking in hospital room?: A Little Help needed climbing 3-5 steps with a railing? : A Little 6 Click Score: 16    End of Session Equipment Utilized During Treatment: Gait belt Activity Tolerance: Patient tolerated treatment well Patient left: in chair;with call bell/phone within reach;with chair alarm set Nurse Communication: Mobility status PT Visit Diagnosis: Difficulty in walking, not elsewhere classified (R26.2);Muscle weakness (generalized) (M62.81)    Time: 6314-9702 PT Time Calculation (min) (ACUTE ONLY): 25 min   Charges:   PT Evaluation $PT Eval Low Complexity: 1 Low     PT G Codes:        Pg [phone]   Margerite Impastato 10/27/2017, 12:27 PM

## 2017-10-27 NOTE — Progress Notes (Signed)
Pharmacy Antibiotic Note  Adriana Sanchez is a 83 y.o. female admitted on 10/25/2017 with weakness and dysphagia.  Pharmacy has been consulted for Fluconazole dosing for oropharangeal candidiasis on 2/27.  Plan:  Fluconazole 200mg  PO x1 dose now, followed by 100mg  PO daily x13 days.  Pharmacy will sign off dosing for now.  Please reconsult if a change in clinical status warrants re-evaluation of dosage.   Height: 4\' 11"  (149.9 cm) Weight: 128 lb 8.5 oz (58.3 kg) IBW/kg (Calculated) : 43.2  Temp (24hrs), Avg:98.7 F (37.1 C), Min:97.2 F (36.2 C), Max:99.9 F (37.7 C)  Recent Labs  Lab 10/25/17 1759 10/25/17 2153 10/26/17 1800 10/27/17 0501  WBC 19.6*  --  14.7* 11.5*  CREATININE 1.00  --  0.77 0.69  LATICACIDVEN  --  0.94  --   --     Estimated Creatinine Clearance: 43.6 mL/min (by C-G formula based on SCr of 0.69 mg/dL).    Allergies  Allergen Reactions  . Aspirin Other (See Comments)    bruising  . Moexipril Other (See Comments)    unknown  . Simvastatin Other (See Comments)    Myalgias (intolerance)  . Sulfa Antibiotics Other (See Comments)    unknown    Antimicrobials this admission: 2/26 Ceftriaxone >> 2/27 Fluconazole >>   Thank you for allowing pharmacy to be a part of this patient's care.  Gretta Arab PharmD, BCPS Pager [phone] 10/27/2017 1:25 PM

## 2017-10-27 NOTE — Progress Notes (Signed)
Pt discharged from the unit via wheelchair. Discharge instructions were reviewed with patient and daughter at discharge. No questions or concerns at discharge. PT was set up through [facility].

## 2017-10-27 NOTE — Progress Notes (Signed)
   10/27/17 1400  Clinical Encounter Type  Visited With Patient  Visit Type Initial  Referral From Patient;Nurse  Consult/Referral To Chaplain  Spiritual Encounters  Spiritual Needs Prayer   Patient shared how she moved to the area about a year ago to be near one of her daughters.  States she is feeling better today.  Is hopeful they can figure out what is going on soon.  Seems to have a good support system with her daughters. Stated she joined a church near [employer] not longer after moving down.  We prayed together.  Will follow and support as needed. Chaplain Katherene Ponto

## 2017-10-28 LAB — URINE CULTURE

## 2017-10-28 NOTE — Discharge Summary (Signed)
[facility] Discharge Summary   Patient: Adriana Sanchez BDZ:[id number]   PCP: Adriana Fillers, MD DOB: 1935/04/23   Date of admission: 10/25/2017   Date of discharge: 10/27/2017    Discharge Diagnoses:  Active Problems:   Hyponatremia   Admitted From: home Disposition:  ALF with home health  Recommendations for Outpatient Follow-up:  1. Please follow-up with PCP in 1 week. 2. Please continue with plan for EGD tomorrow.  Follow-up Information    Sanchez, Adriana Sender, MD. Schedule an appointment as soon as possible for a visit in 1 week(s).   Specialty:  Family Medicine Contact information: [address] [place] [address] [phone]        Care, [facility] Follow up.   Specialty:  Home Health Services Why:  [place] [facility] Contact information: [place] [address] [phone]          Diet recommendation: Carb modified diet  Activity: The patient is advised to gradually reintroduce usual activities.  Discharge Condition: good  Code Status: Full code  History of present illness: As per the H and P dictated on admission, "[name] is a 83 y.o. female with medical history significant of HTN, DM, asthma, breast cancer, GERD who presents with weakness and chronic dysphagia. She states that in January she was treated for similar issues (she thinks UTI and C Diff). Record reviewed and it appears she was admitted on 09/29/17 with chief complaint of weakness, decreased PO intake, diarrhea and was admitted for hypokalemia and hypomagnesemia. She was discharged to SNF after admission. This time, patient admits to weakness, poor appetite with her last meal 2 days ago, "gnawing" abdominal discomfort that has been ongoing for 2 months, nausea, vomiting, dysuria. She denies any fevers, chest pain, or diarrhea. "  Hospital Course:  Summary of her active problems in the hospital is as following. Hyponatremia, hypomagnesemia,  hypokalemia. Dehydration with acute kidney injury. Presented with nausea vomiting -Likely due to poor PO intake  -Patient was given IV fluids as well as replacement of magnesium with significant improvement in her electrolytes. She was able to tolerate p.o. diet and therefore was felt safe to discharge home. At present recommend to hold Lasix and use it only as as needed and resume Micardis.  Also recommend to follow-up with PCP with repeat labs.  Recurrent intractable nausea and vomiting. Oral thrush This is patient's third presentation with nausea and vomiting as well as dysphagia. She also complains about difficulty swallowing as well as food stuck in her throat. I suspect that these symptoms are likely secondary to her recent pharmacy change.  Patient was on metformin and they just change her pharmacy before all these symptoms occurred. Likely side effect of metformin and therefore will switch her metformin to Amaryl on discharge. Also she has some oral thrush would start her on Diflucan.  Patient received 200 mg bolus in the hospital. Patient is already scheduled for an EGD tomorrow which will also help identify any other etiology. She was able to tolerate oral diet without any difficulty.  UTI, POA -UA with moderate leukocytes, positive nitrite, many bacteria, numerous WBC -Urine culture pending -Rocephin was given empirically.  Will discharge on oral Keflex for 3 days.  T wave inversion -New T wave inversion anterior-lateral leads since Jan 2019. Initial trop negative. Patient without chest pain. Likely from electrolyte disturbance.  No further workup necessary.  Weakness -PT eval  recommends home health.  Arranged before discharge  Type  2 diabetes mellitus. Hemoglobin A1c 8.0. Patient is discharged on oral Amaryl. Will require follow-up with PCP in adjustment of her medication. Hold metformin as it might be contributing to patient's GI side effects.  HLD -Continue  lipitor  Dementia -Continue aricept/namenda   Depression -Continue cymbalta  Hypothyroidism -TSH 1.47, free T4 1.24 -Continue synthroid  HTN -Continue lopressor   GERD -Continue PPI   RLS -Continue requip   All other chronic medical condition were stable during the hospitalization.  Patient was seen by physical therapy, who recommended home health, which was arranged by Education officer, museum and case Freight forwarder. On the day of the discharge the patient's vitals were stable, and no other acute medical condition were reported by patient. the patient was felt safe to be discharge at home with home health.  Procedures and Results:  none   Consultations:  none  DISCHARGE MEDICATION: Allergies as of 10/27/2017      Reactions   Aspirin Other (See Comments)   bruising   Moexipril Other (See Comments)   unknown   Simvastatin Other (See Comments)   Myalgias (intolerance)   Sulfa Antibiotics Other (See Comments)   unknown      Medication List    STOP taking these medications   metFORMIN 500 MG tablet Commonly known as:  GLUCOPHAGE   pantoprazole 40 MG tablet Commonly known as:  PROTONIX     TAKE these medications   acidophilus Caps capsule Take 1 capsule by mouth daily.   albuterol 1.25 MG/3ML nebulizer solution Commonly known as:  ACCUNEB Take 1 ampule by nebulization every 6 (six) hours as needed for wheezing.   atorvastatin 10 MG tablet Commonly known as:  LIPITOR Take 10 mg by mouth daily.   BREO ELLIPTA 100-25 MCG/INH Aepb Generic drug:  fluticasone furoate-vilanterol Inhale 1 puff into the lungs daily.   calcium-vitamin D 250-125 MG-UNIT tablet Commonly known as:  OSCAL WITH D Take 1 tablet by mouth daily.   cephALEXin 500 MG capsule Commonly known as:  KEFLEX Take 1 capsule (500 mg total) by mouth 2 (two) times daily for 3 days.   donepezil 10 MG tablet Commonly known as:  ARICEPT Take 10 mg by mouth at bedtime.   DULoxetine 30 MG  capsule Commonly known as:  CYMBALTA Take 30 mg by mouth daily.   feeding supplement (ENSURE ENLIVE) Liqd Take 237 mLs by mouth 2 (two) times daily between meals.   fluconazole 100 MG tablet Commonly known as:  DIFLUCAN Take 1 tablet (100 mg total) by mouth daily.   furosemide 20 MG tablet Commonly known as:  LASIX Take 1 tablet (20 mg total) by mouth daily as needed for fluid or edema. What changed:    when to take this  reasons to take this   glimepiride 1 MG tablet Commonly known as:  AMARYL Take 1 tablet (1 mg total) by mouth every morning.   ketoconazole 2 % cream Commonly known as:  NIZORAL Apply 1 application topically daily.   levothyroxine 50 MCG tablet Commonly known as:  SYNTHROID, LEVOTHROID Take 50 mcg by mouth daily before breakfast.   loratadine 10 MG tablet Commonly known as:  CLARITIN Take 10 mg by mouth daily.   magnesium 30 MG tablet Take 30 mg by mouth daily.   memantine 10 MG tablet Commonly known as:  NAMENDA Take 10 mg by mouth 2 (two) times daily.   metoprolol tartrate 50 MG tablet Commonly known as:  LOPRESSOR Take 50 mg by mouth 2 (two) times  daily.   montelukast 10 MG tablet Commonly known as:  SINGULAIR Take 10 mg by mouth at bedtime.   omeprazole 40 MG capsule Commonly known as:  PRILOSEC Take 1 capsule (40 mg total) by mouth 2 (two) times daily before lunch and supper. What changed:  when to take this   oxybutynin 15 MG 24 hr tablet Commonly known as:  DITROPAN XL Take 15 mg by mouth daily.   polyethylene glycol packet Commonly known as:  MIRALAX / GLYCOLAX Take 17 g by mouth daily.   potassium chloride 10 MEQ tablet Commonly known as:  K-DUR,KLOR-CON Take 10 mEq by mouth daily.   potassium gluconate 595 (99 K) MG Tabs tablet Take 595 mg by mouth daily.   rOPINIRole 1 MG tablet Commonly known as:  REQUIP Take 1 mg by mouth at bedtime.   telmisartan 40 MG tablet Commonly known as:  MICARDIS Take 40 mg by mouth  daily.   tolterodine 4 MG 24 hr capsule Commonly known as:  DETROL LA Take 4 mg by mouth daily.   traMADol 50 MG tablet Commonly known as:  ULTRAM Take 50 mg by mouth daily as needed for moderate pain.   vitamin C 500 MG tablet Commonly known as:  ASCORBIC ACID Take 500 mg by mouth daily.   ZINC 15 PO Take 2 capsules by mouth every evening.      Allergies  Allergen Reactions  . Aspirin Other (See Comments)    bruising  . Moexipril Other (See Comments)    unknown  . Simvastatin Other (See Comments)    Myalgias (intolerance)  . Sulfa Antibiotics Other (See Comments)    unknown   Discharge Instructions    Diet - low sodium heart healthy   Complete by:  As directed    Increase activity slowly   Complete by:  As directed      Discharge Exam: Filed Weights   10/25/17 1718 10/26/17 1748  Weight: 56.2 kg (124 lb) 58.3 kg (128 lb 8.5 oz)   Vitals:   10/27/17 0605 10/27/17 1352  BP: 121/73 (!) 141/63  Pulse: 89 81  Resp: 18 16  Temp: 99.9 F (37.7 C) 99.1 F (37.3 C)  SpO2: 96% 96%   General: Appear in no distress, no Rash; Oral Mucosa moist. Cardiovascular: S1 and S2 Present, no Murmur, no JVD Respiratory: Bilateral Air entry present and Clear to Auscultation, no Crackles, no wheezes Abdomen: Bowel Sound present, Soft and no tenderness Extremities: no Pedal edema, no calf tenderness Neurology: Grossly no focal neuro deficit.  The results of significant diagnostics from this hospitalization (including imaging, microbiology, ancillary and laboratory) are listed below for reference.    Significant Diagnostic Studies: Dg Chest 2 View  Result Date: 10/25/2017 CLINICAL DATA:  Weakness EXAM: CHEST  2 VIEW COMPARISON:  05/31/2017 FINDINGS: The heart size and mediastinal contours are within normal limits. Both lungs are clear. The visualized skeletal structures are unremarkable. IMPRESSION: No active cardiopulmonary disease. Electronically Signed   By: Rolm Baptise M.D.    On: 10/25/2017 21:42   Dg Abd 2 Views  Result Date: 10/27/2017 CLINICAL DATA:  Abdominal pain with nausea, vomiting, and dysuria. EXAM: ABDOMEN - 2 VIEW COMPARISON:  None. FINDINGS: No free air or free fluid. No distended large or small bowel. Moderate amount of air in the large and small bowel. Surgical clips from previous cholecystectomy. Multilevel degenerative disc disease. Aortic atherosclerosis. IMPRESSION: No acute abnormality of the abdomen. Aortic Atherosclerosis (ICD10-I70.0). Electronically Signed   By:  Lorriane Shire M.D.   On: 10/27/2017 10:29    Microbiology: No results found for this or any previous visit (from the past 240 hour(s)).   Labs: CBC: Recent Labs  Lab 10/25/17 1759 10/26/17 1800 10/27/17 0501  WBC 19.6* 14.7* 11.5*  NEUTROABS 17.2*  --   --   HGB 12.4 10.9* 10.4*  HCT 35.4* 32.3* 29.5*  MCV 91.2 94.7 94.9  PLT 263 256 407   Basic Metabolic Panel: Recent Labs  Lab 10/25/17 1759 10/26/17 1800 10/27/17 0501 10/27/17 1300  NA 127* 137 138  --   K 4.2 4.7 4.2  --   CL 90* 102 104  --   CO2 23 24 24   --   GLUCOSE 200* 161* 163*  --   BUN 26* 18 15  --   CREATININE 1.00 0.77 0.69  --   CALCIUM 8.9 8.8* 8.2*  --   MG 1.1*  --  1.5* 2.2   Liver Function Tests: Recent Labs  Lab 10/25/17 1759  AST 27  ALT 29  ALKPHOS 56  BILITOT 0.9  PROT 7.8  ALBUMIN 3.7   No results for input(s): LIPASE, AMYLASE in the last 168 hours. No results for input(s): AMMONIA in the last 168 hours. Cardiac Enzymes: Recent Labs  Lab 10/25/17 1759 10/26/17 1800  TROPONINI <0.03 <0.03   BNP (last 3 results) No results for input(s): BNP in the last 8760 hours. CBG: Recent Labs  Lab 10/26/17 1627 10/26/17 2120 10/27/17 0803 10/27/17 1218 10/27/17 1636  GLUCAP 162* 214* 142* 184* 141*   Time spent: 35 minutes  Signed:  Berle Mull  [facility] 10/27/2017 , 8:49 AM

## 2019-07-21 IMAGING — DX DG ABDOMEN 2V
2 series · 2 of 2 positions shown · non-contrast
Comparison: None.

CLINICAL DATA: Abdominal pain with nausea, vomiting, and dysuria.

EXAM:
ABDOMEN - 2 VIEW

[abdomen supine]
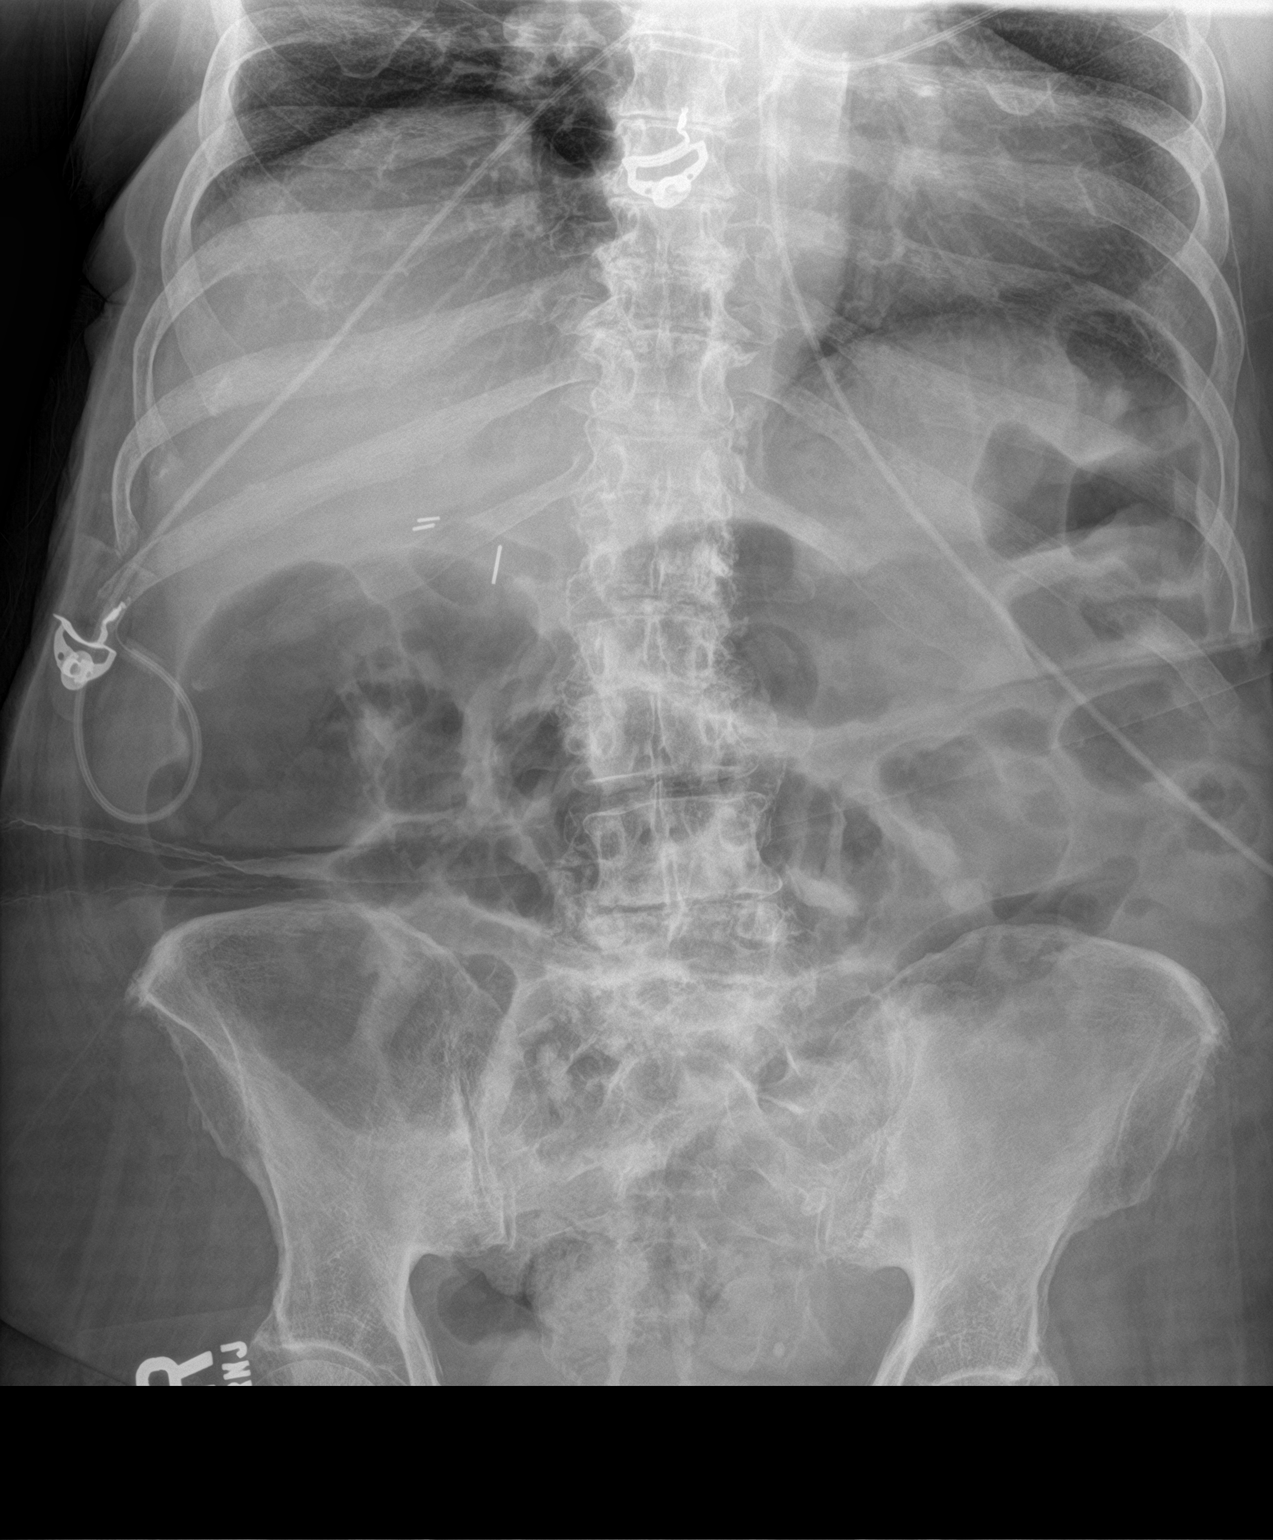

[abdomen decu]
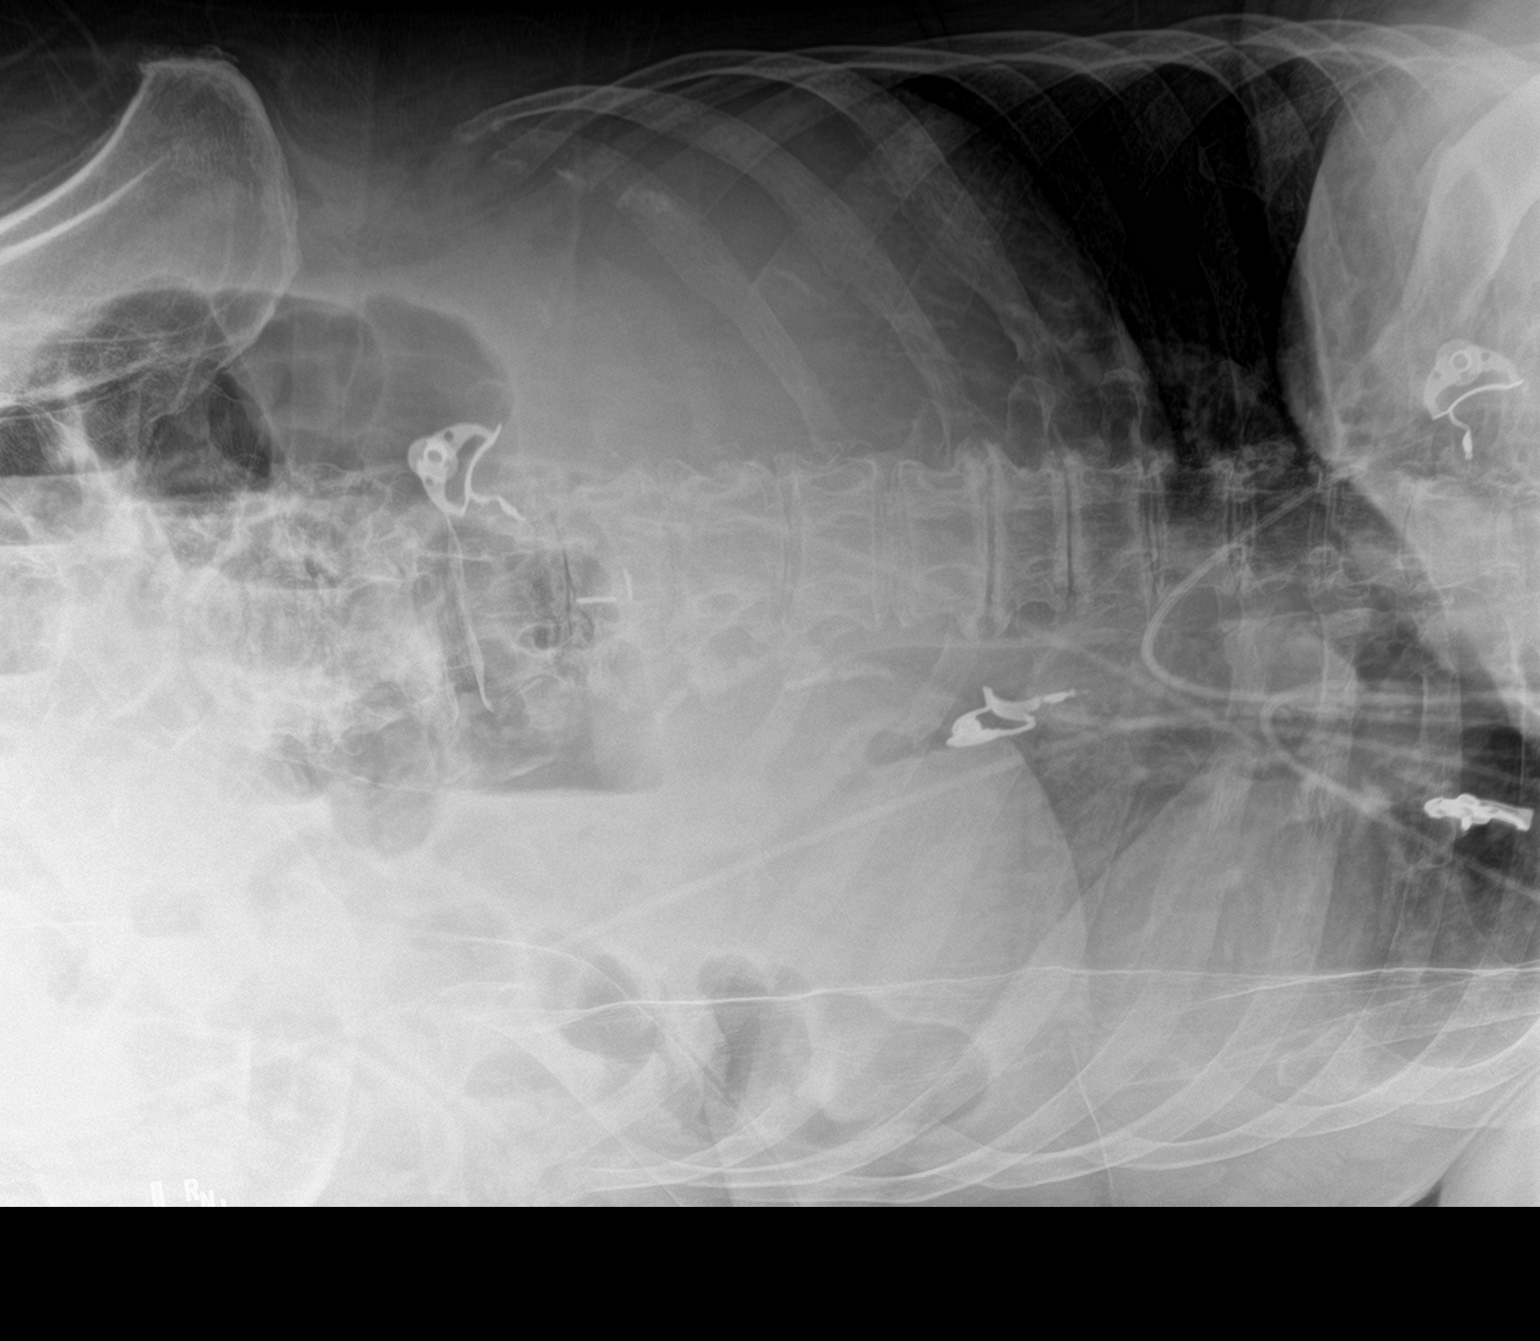

[2 of 2 positions shown; findings below may reference images not displayed]

FINDINGS: No free air or free fluid. No distended large or small bowel.
Moderate amount of air in the large and small bowel. Surgical clips
from previous cholecystectomy. Multilevel degenerative disc disease.
Aortic atherosclerosis.
IMPRESSION: No acute abnormality of the abdomen.

Aortic Atherosclerosis ([id number]).

## 2021-05-31 DEATH — deceased
